# Patient Record
Sex: Female | Born: 1980 | Race: Black or African American | Hispanic: No | Marital: Single | Smoking: Current every day smoker
Health system: Southern US, Community
[De-identification: ages and names within clinical notes are randomized; demographics above are authoritative.]

## PROBLEM LIST (undated history)

## (undated) HISTORY — PX: OTHER SURGICAL HISTORY: SHX169

---

## 2009-01-12 ENCOUNTER — Emergency Department (HOSPITAL_BASED_OUTPATIENT_CLINIC_OR_DEPARTMENT_OTHER): Admission: EM | Admit: 2009-01-12 | Discharge: 2009-01-12 | Payer: Self-pay | Admitting: Emergency Medicine

## 2009-01-12 ENCOUNTER — Ambulatory Visit: Payer: Self-pay | Admitting: Diagnostic Radiology

## 2015-04-16 ENCOUNTER — Encounter (HOSPITAL_BASED_OUTPATIENT_CLINIC_OR_DEPARTMENT_OTHER): Payer: Self-pay | Admitting: Emergency Medicine

## 2015-04-16 ENCOUNTER — Emergency Department (HOSPITAL_BASED_OUTPATIENT_CLINIC_OR_DEPARTMENT_OTHER)
Admission: EM | Admit: 2015-04-16 | Discharge: 2015-04-16 | Disposition: A | Discharge status: Home / Self Care | Payer: Medicaid Other | Attending: Emergency Medicine | Admitting: Emergency Medicine

## 2015-04-16 DIAGNOSIS — M549 Dorsalgia, unspecified: Secondary | ICD-10-CM | POA: Diagnosis present

## 2015-04-16 DIAGNOSIS — Z3202 Encounter for pregnancy test, result negative: Secondary | ICD-10-CM | POA: Insufficient documentation

## 2015-04-16 DIAGNOSIS — M545 Low back pain, unspecified: Secondary | ICD-10-CM

## 2015-04-16 DIAGNOSIS — Z720 Tobacco use: Secondary | ICD-10-CM | POA: Diagnosis not present

## 2015-04-16 LAB — URINALYSIS, ROUTINE W REFLEX MICROSCOPIC
BILIRUBIN URINE: NEGATIVE
Glucose, UA: NEGATIVE mg/dL
Hgb urine dipstick: NEGATIVE
Ketones, ur: NEGATIVE mg/dL
Nitrite: NEGATIVE
PH: 7 (ref 5.0–8.0)
Protein, ur: NEGATIVE mg/dL
Specific Gravity, Urine: 1.02 (ref 1.005–1.030)
Urobilinogen, UA: 1 mg/dL (ref 0.0–1.0)

## 2015-04-16 LAB — URINE MICROSCOPIC-ADD ON

## 2015-04-16 LAB — PREGNANCY, URINE: PREG TEST UR: NEGATIVE

## 2015-04-16 MED ORDER — CYCLOBENZAPRINE HCL 10 MG PO TABS: 10.0000 mg | ORAL_TABLET | Freq: Two times a day (BID) | ORAL | Status: DC | PRN

## 2015-04-16 MED ORDER — KETOROLAC TROMETHAMINE 60 MG/2ML IM SOLN
60.0000 mg | Freq: Once | INTRAMUSCULAR | Status: AC
Start: 2015-04-16 — End: 2015-04-16
  Administered 2015-04-16: 60 mg via INTRAMUSCULAR
  Filled 2015-04-16: qty 2

## 2015-04-16 MED ORDER — NAPROXEN 500 MG PO TABS: 500.0000 mg | ORAL_TABLET | Freq: Two times a day (BID) | ORAL | Status: DC

## 2015-04-16 NOTE — ED Notes (Signed)
Lower back pain since last night. Denies injury.
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
## 2015-04-16 NOTE — ED Notes (Signed)
Denies numbness or tingling in lateral LW.  Grimacing and reports increased pain with straight leg exam.  Denies bowel or bladder incontinence. Denies recent injury.

## 2015-04-16 NOTE — Discharge Instructions (Signed)
Back Pain, Adult Low back pain is very common. About 1 in 5 people have back pain.The cause of low back pain is rarely dangerous. The pain often gets better over time.About half of people with a sudden onset of back pain feel better in just 2 weeks. About 8 in 10 people feel better by 6 weeks.  CAUSES Some common causes of back pain include:  Strain of the muscles or ligaments supporting the spine.  Wear and tear (degeneration) of the spinal discs.  Arthritis.  Direct injury to the back. DIAGNOSIS Most of the time, the direct cause of low back pain is not known.However, back pain can be treated effectively even when the exact cause of the pain is unknown.Answering your caregiver's questions about your overall health and symptoms is one of the most accurate ways to make sure the cause of your pain is not dangerous. If your caregiver needs more information, he or she may order lab work or imaging tests (X-rays or MRIs).However, even if imaging tests show changes in your back, this usually does not require surgery. HOME CARE INSTRUCTIONS For many people, back pain returns.Since low back pain is rarely dangerous, it is often a condition that people can learn to manageon their own.   Remain active. It is stressful on the back to sit or stand in one place. Do not sit, drive, or stand in one place for more than 30 minutes at a time. Take short walks on level surfaces as soon as pain allows.Try to increase the length of time you walk each day.  Do not stay in bed.Resting more than 1 or 2 days can delay your recovery.  Do not avoid exercise or work.Your body is made to move.It is not dangerous to be active, even though your back may hurt.Your back will likely heal faster if you return to being active before your pain is gone.  Pay attention to your body when you bend and lift. Many people have less discomfortwhen lifting if they bend their knees, keep the load close to their bodies,and  avoid twisting. Often, the most comfortable positions are those that put less stress on your recovering back.  Find a comfortable position to sleep. Use a firm mattress and lie on your side with your knees slightly bent. If you lie on your back, put a pillow under your knees.  Only take over-the-counter or prescription medicines as directed by your caregiver. Over-the-counter medicines to reduce pain and inflammation are often the most helpful.Your caregiver may prescribe muscle relaxant drugs.These medicines help dull your pain so you can more quickly return to your normal activities and healthy exercise.  Put ice on the injured area.  Put ice in a plastic bag.  Place a towel between your skin and the bag.  Leave the ice on for 15-20 minutes, 03-04 times a day for the first 2 to 3 days. After that, ice and heat may be alternated to reduce pain and spasms.  Ask your caregiver about trying back exercises and gentle massage. This may be of some benefit.  Avoid feeling anxious or stressed.Stress increases muscle tension and can worsen back pain.It is important to recognize when you are anxious or stressed and learn ways to manage it.Exercise is a great option. SEEK MEDICAL CARE IF:  You have pain that is not relieved with rest or medicine.  You have pain that does not improve in 1 week.  You have new symptoms.  You are generally not feeling well. SEEK   IMMEDIATE MEDICAL CARE IF:   You have pain that radiates from your back into your legs.  You develop new bowel or bladder control problems.  You have unusual weakness or numbness in your arms or legs.  You develop nausea or vomiting.  You develop abdominal pain.  You feel faint. Document Released: 10/02/2005 Document Revised: 04/02/2012 Document Reviewed: 02/03/2014 Exit[employer] Patient Information 2015 [employer]. This information is not intended to replace advice given to you by your health care provider. Make sure you  discuss any questions you have with your health care provider.  

## 2015-04-18 LAB — URINE CULTURE: Special Requests: NORMAL

## 2018-08-07 ENCOUNTER — Emergency Department (HOSPITAL_BASED_OUTPATIENT_CLINIC_OR_DEPARTMENT_OTHER): Payer: Medicaid Other

## 2018-08-07 ENCOUNTER — Other Ambulatory Visit: Payer: Self-pay

## 2018-08-07 ENCOUNTER — Encounter (HOSPITAL_BASED_OUTPATIENT_CLINIC_OR_DEPARTMENT_OTHER): Payer: Self-pay | Admitting: Emergency Medicine

## 2018-08-07 ENCOUNTER — Emergency Department (HOSPITAL_BASED_OUTPATIENT_CLINIC_OR_DEPARTMENT_OTHER)
Admission: EM | Admit: 2018-08-07 | Discharge: 2018-08-07 | Disposition: A | Discharge status: Home / Self Care | Payer: Medicaid Other | Attending: Emergency Medicine | Admitting: Emergency Medicine

## 2018-08-07 DIAGNOSIS — N83291 Other ovarian cyst, right side: Secondary | ICD-10-CM | POA: Insufficient documentation

## 2018-08-07 DIAGNOSIS — F17210 Nicotine dependence, cigarettes, uncomplicated: Secondary | ICD-10-CM | POA: Insufficient documentation

## 2018-08-07 DIAGNOSIS — Z79899 Other long term (current) drug therapy: Secondary | ICD-10-CM | POA: Insufficient documentation

## 2018-08-07 DIAGNOSIS — N83209 Unspecified ovarian cyst, unspecified side: Secondary | ICD-10-CM

## 2018-08-07 DIAGNOSIS — R109 Unspecified abdominal pain: Secondary | ICD-10-CM | POA: Diagnosis present

## 2018-08-07 LAB — LIPASE, BLOOD: Lipase: 29 U/L (ref 11–51)

## 2018-08-07 LAB — COMPREHENSIVE METABOLIC PANEL
ALBUMIN: 3.7 g/dL (ref 3.5–5.0)
ALK PHOS: 50 U/L (ref 38–126)
ALT: 13 U/L (ref 0–44)
ANION GAP: 6 (ref 5–15)
AST: 14 U/L — ABNORMAL LOW (ref 15–41)
BUN: 12 mg/dL (ref 6–20)
CALCIUM: 8.4 mg/dL — AB (ref 8.9–10.3)
CO2: 26 mmol/L (ref 22–32)
CREATININE: 0.67 mg/dL (ref 0.44–1.00)
Chloride: 107 mmol/L (ref 98–111)
GFR calc Af Amer: >60 mL/min (ref >60)
GFR calc non Af Amer: >60 mL/min (ref >60)
Glucose, Bld: 91 mg/dL (ref 70–99)
Potassium: 3.6 mmol/L (ref 3.5–5.1)
SODIUM: 139 mmol/L (ref 135–145)
Total Bilirubin: 0.7 mg/dL (ref 0.3–1.2)
Total Protein: 6.9 g/dL (ref 6.5–8.1)

## 2018-08-07 LAB — CBC WITH DIFFERENTIAL/PLATELET
ABS IMMATURE GRANULOCYTES: 0.05 10*3/uL (ref 0.00–0.07)
BASOS ABS: 0.1 10*3/uL (ref 0.0–0.1)
Basophils Relative: 0 %
Eosinophils Absolute: 0.6 10*3/uL — ABNORMAL HIGH (ref 0.0–0.5)
Eosinophils Relative: 4 %
HCT: 38.7 % (ref 36.0–46.0)
HEMOGLOBIN: 12.2 g/dL (ref 12.0–15.0)
IMMATURE GRANULOCYTES: 0 %
LYMPHS PCT: 7 %
Lymphs Abs: 1 10*3/uL (ref 0.7–4.0)
MCH: 29.5 pg (ref 26.0–34.0)
MCHC: 31.5 g/dL (ref 30.0–36.0)
MCV: 93.5 fL (ref 80.0–100.0)
Monocytes Absolute: 0.6 10*3/uL (ref 0.1–1.0)
Monocytes Relative: 5 %
NEUTROS ABS: 11.3 10*3/uL — AB (ref 1.7–7.7)
NRBC: 0 % (ref 0.0–0.2)
Neutrophils Relative %: 84 %
Platelets: 274 10*3/uL (ref 150–400)
RBC: 4.14 MIL/uL (ref 3.87–5.11)
RDW: 14.3 % (ref 11.5–15.5)
WBC: 13.5 10*3/uL — AB (ref 4.0–10.5)

## 2018-08-07 LAB — URINALYSIS, MICROSCOPIC (REFLEX)

## 2018-08-07 LAB — URINALYSIS, ROUTINE W REFLEX MICROSCOPIC
BILIRUBIN URINE: NEGATIVE
GLUCOSE, UA: NEGATIVE mg/dL
Hgb urine dipstick: NEGATIVE
Ketones, ur: NEGATIVE mg/dL
Nitrite: NEGATIVE
PH: 6 (ref 5.0–8.0)
Protein, ur: NEGATIVE mg/dL
SPECIFIC GRAVITY, URINE: 1.02 (ref 1.005–1.030)

## 2018-08-07 LAB — PREGNANCY, URINE: Preg Test, Ur: NEGATIVE

## 2018-08-07 MED ORDER — LIDOCAINE-EPINEPHRINE 2 %-1:100000 IJ SOLN
20.0000 mL | Freq: Once | INTRAMUSCULAR | Status: DC
  Filled 2018-08-07: qty 20

## 2018-08-07 MED ORDER — MORPHINE SULFATE (PF) 4 MG/ML IV SOLN
4.0000 mg | Freq: Once | INTRAVENOUS | Status: AC
  Administered 2018-08-07: 4 mg via INTRAVENOUS
  Filled 2018-08-07: qty 1

## 2018-08-07 MED ORDER — IOPAMIDOL (ISOVUE-300) INJECTION 61%
100.0000 mL | Freq: Once | INTRAVENOUS | Status: AC | PRN
  Administered 2018-08-07: 100 mL via INTRAVENOUS

## 2018-08-07 MED ORDER — SODIUM CHLORIDE 0.9 % IV BOLUS
1000.0000 mL | Freq: Once | INTRAVENOUS | Status: AC
  Administered 2018-08-07: 1000 mL via INTRAVENOUS

## 2018-08-07 NOTE — ED Triage Notes (Signed)
Abd pain x 4 days, dysuria. Last BM this am, per norm. Vomited x 1 earlier

## 2018-08-07 NOTE — ED Notes (Signed)
Patient transported to Ultrasound 

## 2018-08-07 NOTE — ED Notes (Signed)
ED Provider at bedside. 

## 2018-08-07 NOTE — ED Notes (Signed)
Patient transported to CT 

## 2018-08-07 NOTE — ED Notes (Signed)
Patient is resting comfortably. Family at bedside.  

## 2018-08-07 NOTE — Discharge Instructions (Signed)
Take Tylenol and ibuprofen as needed for pain.  Follow-up with OB/GYN as directed.

## 2018-08-07 NOTE — ED Provider Notes (Signed)
[facility] EMERGENCY DEPARTMENT Provider Note   CSN: [id number] Arrival date & time: 08/07/18  4098     History   Chief Complaint Chief Complaint  Patient presents with  . Abdominal Pain    HPI Heather Maddox is a 37 y.o. female.  Patient with no significant medical or surgical history, currently not pregnant presents with worsening generalized abdominal pain without radiation for the past 4 days.  Vomited once, mild nausea.  No vaginal symptoms.  Mild worsening with urination.  No history of similar     History reviewed. No pertinent past medical history.  There are no active problems to display for this patient.   Past Surgical History:  Procedure Laterality Date  . OTHER SURGICAL HISTORY      birthmark removed from forehead age 7     OB History    Gravida  3   Para  2   Term  2   Preterm  0   AB  1   Living  2     SAB  1   TAB  0   Ectopic  0   Multiple  0   Live Births  2            Home Medications    Prior to Admission medications   Medication Sig Start Date End Date Taking? Authorizing Provider  cyclobenzaprine (FLEXERIL) 10 MG tablet Take 1 tablet (10 mg total) by mouth 2 (two) times daily as needed for muscle spasms. 04/16/15   Rolan Bucco, MD  naproxen (NAPROSYN) 500 MG tablet Take 1 tablet (500 mg total) by mouth 2 (two) times daily. 04/16/15   Rolan Bucco, MD    Family History No family history on file.  Social History Social History   Tobacco Use  . Smoking status: Current Every Day Smoker    Packs/day: 0.20    Years: 21.00    Pack years: 4.20    Types: Cigarettes  . Smokeless tobacco: Never Used  Substance Use Topics  . Alcohol use: Yes    Comment: socially  . Drug use: Yes    Types: Marijuana    Comment: daily     Allergies   Patient has no known allergies.   Review of Systems Review of Systems   Physical Exam Updated Vital Signs BP 124/87 (BP Location: Left Arm)   Pulse 62   Temp  97.8 F (36.6 C) (Oral)   Resp 16   Ht 5\' 3"  (1.6 m)   Wt 50.1 kg   LMP 06/30/2018   SpO2 100%   BMI 19.57 kg/m   Physical Exam  Constitutional: She is oriented to person, place, and time. She appears well-developed and well-nourished.  HENT:  Head: Normocephalic and atraumatic.  Eyes: Conjunctivae are normal. Right eye exhibits no discharge. Left eye exhibits no discharge.  Neck: Normal range of motion. Neck supple. No tracheal deviation present.  Cardiovascular: Normal rate and regular rhythm.  Pulmonary/Chest: Effort normal and breath sounds normal.  Abdominal: Soft. She exhibits no distension. There is tenderness. There is guarding (periumbilical).  Musculoskeletal: She exhibits no edema.  Neurological: She is alert and oriented to person, place, and time.  Skin: Skin is warm. No rash noted.  Psychiatric: She has a normal mood and affect.  Nursing note and vitals reviewed.    ED Treatments / Results  Labs (all labs ordered are listed, but only abnormal results are displayed) Labs Reviewed  URINALYSIS, ROUTINE W REFLEX MICROSCOPIC - Abnormal; Notable  for the following components:      Result Value   Leukocytes, UA MODERATE (*)    All other components within normal limits  CBC WITH DIFFERENTIAL/PLATELET - Abnormal; Notable for the following components:   WBC 13.5 (*)    Neutro Abs 11.3 (*)    Eosinophils Absolute 0.6 (*)    All other components within normal limits  COMPREHENSIVE METABOLIC PANEL - Abnormal; Notable for the following components:   Calcium 8.4 (*)    AST 14 (*)    All other components within normal limits  URINALYSIS, MICROSCOPIC (REFLEX) - Abnormal; Notable for the following components:   Bacteria, UA RARE (*)    All other components within normal limits  PREGNANCY, URINE  LIPASE, BLOOD    EKG None  Radiology US Transvaginal Non-ob  Result Date: 08/07/2018 CLINICAL DATA:  Abdominal and pelvic pain. Cystic adnexal lesion seen on recent CT.  LMP 06/30/2018. EXAM: TRANSABDOMINAL AND TRANSVAGINAL ULTRASOUND OF PELVIS DOPPLER ULTRASOUND OF OVARIES TECHNIQUE: Both transabdominal and transvaginal ultrasound examinations of the pelvis were performed. Transabdominal technique was performed for global imaging of the pelvis including uterus, ovaries, adnexal regions, and pelvic cul-de-sac. It was necessary to proceed with endovaginal exam following the transabdominal exam to visualize the endometrium and ovaries. Color and duplex Doppler ultrasound was utilized to evaluate blood flow to the ovaries. COMPARISON:  CT on 08/07/2018 FINDINGS: Uterus Measurements: 10.8 x 4.9 x 5.8 cm. Retroverted. No fibroids or other mass visualized. Endometrium Thickness: 13 mm.  No focal abnormality visualized. Right ovary Measurements: 5.5 x 3.4 x 4.9 cm. A hemorrhagic cyst with reticular pattern of internal echos and lack of internal blood flow on color Doppler is seen which measures 4.3 cm. Left ovary Measurements: 3.9 x 2.0 x 3.1 cm. Normal appearance/no adnexal mass. Pulsed Doppler evaluation of both ovaries demonstrates normal low-resistance arterial and venous waveforms. Other findings No abnormal free fluid. IMPRESSION: 4.3 cm right ovarian hemorrhagic cyst. Recommend continued follow-up by ultrasound in 6-12 weeks to confirm resolution. This recommendation follows the consensus statement: Management of Asymptomatic Ovarian and Other Adnexal Cysts Imaged at [place]: Society of Radiologists in Ultrasound Consensus Conference Statement. Radiology 2010; [phone]. No sonographic evidence for ovarian torsion. Normal appearance of uterus and left ovary. Electronically Signed   By: Myles Rosenthal M.D.   On: 08/07/2018 15:33   US Pelvis Complete  Result Date: 08/07/2018 CLINICAL DATA:  Abdominal and pelvic pain. Cystic adnexal lesion seen on recent CT. LMP 06/30/2018. EXAM: TRANSABDOMINAL AND TRANSVAGINAL ULTRASOUND OF PELVIS DOPPLER ULTRASOUND OF OVARIES TECHNIQUE: Both  transabdominal and transvaginal ultrasound examinations of the pelvis were performed. Transabdominal technique was performed for global imaging of the pelvis including uterus, ovaries, adnexal regions, and pelvic cul-de-sac. It was necessary to proceed with endovaginal exam following the transabdominal exam to visualize the endometrium and ovaries. Color and duplex Doppler ultrasound was utilized to evaluate blood flow to the ovaries. COMPARISON:  CT on 08/07/2018 FINDINGS: Uterus Measurements: 10.8 x 4.9 x 5.8 cm. Retroverted. No fibroids or other mass visualized. Endometrium Thickness: 13 mm.  No focal abnormality visualized. Right ovary Measurements: 5.5 x 3.4 x 4.9 cm. A hemorrhagic cyst with reticular pattern of internal echos and lack of internal blood flow on color Doppler is seen which measures 4.3 cm. Left ovary Measurements: 3.9 x 2.0 x 3.1 cm. Normal appearance/no adnexal mass. Pulsed Doppler evaluation of both ovaries demonstrates normal low-resistance arterial and venous waveforms. Other findings No abnormal free fluid. IMPRESSION: 4.3 cm right ovarian hemorrhagic  cyst. Recommend continued follow-up by ultrasound in 6-12 weeks to confirm resolution. This recommendation follows the consensus statement: Management of Asymptomatic Ovarian and Other Adnexal Cysts Imaged at [place]: Society of Radiologists in Ultrasound Consensus Conference Statement. Radiology 2010; [phone]. No sonographic evidence for ovarian torsion. Normal appearance of uterus and left ovary. Electronically Signed   By: Myles Rosenthal M.D.   On: 08/07/2018 15:33   Ct Abdomen Pelvis W Contrast  Result Date: 08/07/2018 CLINICAL DATA:  Abdominal pain.  Vomiting.  Dysuria. EXAM: CT ABDOMEN AND PELVIS WITH CONTRAST TECHNIQUE: Multidetector CT imaging of the abdomen and pelvis was performed using the standard protocol following bolus administration of intravenous contrast. CONTRAST:  ISOVUE-300 IOPAMIDOL (ISOVUE-300) INJECTION 61%  COMPARISON:  None. FINDINGS: Lower chest: No acute abnormality. Hepatobiliary: There are several low-attenuation foci within the liver which measure less than 1 cm and are too small to reliably characterize. The gallbladder appears normal. No biliary dilatation. Pancreas: Unremarkable. No pancreatic ductal dilatation or surrounding inflammatory changes. Spleen: Normal in size without focal abnormality. Adrenals/Urinary Tract: Normal appearance of the adrenal glands. Kidneys are normal in appearance. Urinary bladder appears normal. Stomach/Bowel: Stomach negative. Small bowel loops are unremarkable. The appendix is not confidently identified separate from the right lower quadrant bowel loops. No secondary signs of acute appendicitis however. No pathologic dilatation of the colon. Vascular/Lymphatic: Normal appearance of the abdominal aorta. The portal vein and hepatic veins appear patent. No abdominal adenopathy. No pelvic or inguinal adenopathy. Reproductive: The uterus appears retroflexed. Small amount of fluid noted within the endometrial canal. The nabothian cysts identified within the cervix. There is increased vascularity and multiple large bilateral parametrial veins identified which may be seen with pelvic venous congestion. In the right adnexa there is a cyst measuring 3.2 cm, image 57/2. Other: No abdominal wall hernia or abnormality. No abdominopelvic ascites. Musculoskeletal: No acute or significant osseous findings. IMPRESSION: 1. There is increased vascularity and prominent bilateral parametrial veins within the pelvis which may be seen with pelvic venous congestion. 2. 3 cm right ovary cyst. 3. Nonvisualization of the appendix. No secondary signs of acute appendicitis Electronically Signed   By: Signa Kell M.D.   On: 08/07/2018 11:27   [place] Art/ven Flow Abd Pelv Doppler  Result Date: 08/07/2018 CLINICAL DATA:  Abdominal and pelvic pain. Cystic adnexal lesion seen on recent CT. LMP 06/30/2018.  EXAM: TRANSABDOMINAL AND TRANSVAGINAL ULTRASOUND OF PELVIS DOPPLER ULTRASOUND OF OVARIES TECHNIQUE: Both transabdominal and transvaginal ultrasound examinations of the pelvis were performed. Transabdominal technique was performed for global imaging of the pelvis including uterus, ovaries, adnexal regions, and pelvic cul-de-sac. It was necessary to proceed with endovaginal exam following the transabdominal exam to visualize the endometrium and ovaries. Color and duplex Doppler ultrasound was utilized to evaluate blood flow to the ovaries. COMPARISON:  CT on 08/07/2018 FINDINGS: Uterus Measurements: 10.8 x 4.9 x 5.8 cm. Retroverted. No fibroids or other mass visualized. Endometrium Thickness: 13 mm.  No focal abnormality visualized. Right ovary Measurements: 5.5 x 3.4 x 4.9 cm. A hemorrhagic cyst with reticular pattern of internal echos and lack of internal blood flow on color Doppler is seen which measures 4.3 cm. Left ovary Measurements: 3.9 x 2.0 x 3.1 cm. Normal appearance/no adnexal mass. Pulsed Doppler evaluation of both ovaries demonstrates normal low-resistance arterial and venous waveforms. Other findings No abnormal free fluid. IMPRESSION: 4.3 cm right ovarian hemorrhagic cyst. Recommend continued follow-up by ultrasound in 6-12 weeks to confirm resolution. This recommendation follows the consensus statement: Management of  Asymptomatic Ovarian and Other Adnexal Cysts Imaged at [place]: Society of Radiologists in Ultrasound Consensus Conference Statement. Radiology 2010; [phone]. No sonographic evidence for ovarian torsion. Normal appearance of uterus and left ovary. Electronically Signed   By: Myles Rosenthal M.D.   On: 08/07/2018 15:33    Procedures Procedures (including critical care time)  Medications Ordered in ED Medications  morphine 4 MG/ML injection 4 mg (4 mg Intravenous Given 08/07/18 1052)  sodium chloride 0.9 % bolus 1,000 mL (0 mLs Intravenous Stopped 08/07/18 1243)  iopamidol (ISOVUE-300)  61 % injection 100 mL (100 mLs Intravenous Contrast Given 08/07/18 1041)  morphine 4 MG/ML injection 4 mg (4 mg Intravenous Given 08/07/18 1423)     Initial Impression / Assessment and Plan / ED Course  I have reviewed the triage vital signs and the nursing notes.  Pertinent labs & imaging results that were available during my care of the patient were reviewed by me and considered in my medical decision making (see chart for details).    Patient presents with diffuse abdominal pain worsening for 4 days.  No focality to pain however with guarding and worsening symptoms plan for CT scan for further delineation.  Blood work reviewed mild leukocytosis. Reviewed CT scan with patient no acute findings, dilation of pelvic veins and 3 cm cyst which may be contributing to pain.  Ultrasound ordered to check blood flow and any other cause of patient's persistent pain.  Patient has mild leukocytosis.  UA unremarkable.,  Ultrasound showed hemorrhagic cyst.  Patient will follow-up with OB/GYN.  Pain improved in the ER.  Final Clinical Impressions(s) / ED Diagnoses   Final diagnoses:  Acute abdominal pain  Cyst of ovary, unspecified laterality    ED Discharge Orders    None       Blane Ohara, MD 08/07/18 1541

## 2018-10-05 ENCOUNTER — Emergency Department (HOSPITAL_BASED_OUTPATIENT_CLINIC_OR_DEPARTMENT_OTHER)
Admission: EM | Admit: 2018-10-05 | Discharge: 2018-10-05 | Disposition: A | Discharge status: Home / Self Care | Payer: Medicaid Other | Attending: Emergency Medicine | Admitting: Emergency Medicine

## 2018-10-05 ENCOUNTER — Other Ambulatory Visit: Payer: Self-pay

## 2018-10-05 ENCOUNTER — Encounter (HOSPITAL_BASED_OUTPATIENT_CLINIC_OR_DEPARTMENT_OTHER): Payer: Self-pay | Admitting: Emergency Medicine

## 2018-10-05 ENCOUNTER — Emergency Department (HOSPITAL_BASED_OUTPATIENT_CLINIC_OR_DEPARTMENT_OTHER): Payer: Medicaid Other

## 2018-10-05 DIAGNOSIS — Z79899 Other long term (current) drug therapy: Secondary | ICD-10-CM | POA: Diagnosis not present

## 2018-10-05 DIAGNOSIS — B9789 Other viral agents as the cause of diseases classified elsewhere: Secondary | ICD-10-CM | POA: Diagnosis not present

## 2018-10-05 DIAGNOSIS — R05 Cough: Secondary | ICD-10-CM | POA: Diagnosis not present

## 2018-10-05 DIAGNOSIS — J069 Acute upper respiratory infection, unspecified: Secondary | ICD-10-CM

## 2018-10-05 DIAGNOSIS — F17210 Nicotine dependence, cigarettes, uncomplicated: Secondary | ICD-10-CM | POA: Diagnosis not present

## 2018-10-05 MED ORDER — ALBUTEROL SULFATE HFA 108 (90 BASE) MCG/ACT IN AERS
INHALATION_SPRAY | RESPIRATORY_TRACT | Status: AC
  Filled 2018-10-05: qty 6.7

## 2018-10-05 MED ORDER — ALBUTEROL SULFATE (2.5 MG/3ML) 0.083% IN NEBU
5.0000 mg | INHALATION_SOLUTION | Freq: Once | RESPIRATORY_TRACT | Status: AC
  Administered 2018-10-05: 5 mg via RESPIRATORY_TRACT
  Filled 2018-10-05: qty 6

## 2018-10-05 MED ORDER — ALBUTEROL SULFATE HFA 108 (90 BASE) MCG/ACT IN AERS
2.0000 | INHALATION_SPRAY | RESPIRATORY_TRACT | Status: DC | PRN
  Administered 2018-10-05: 2 via RESPIRATORY_TRACT

## 2018-10-05 MED ORDER — PREDNISONE 50 MG PO TABS
50.0000 mg | ORAL_TABLET | Freq: Once | ORAL | Status: AC
  Administered 2018-10-05: 50 mg via ORAL
  Filled 2018-10-05: qty 1

## 2018-10-05 MED ORDER — PREDNISONE 20 MG PO TABS: 40.0000 mg | ORAL_TABLET | Freq: Every day | ORAL | 0 refills | Status: AC

## 2018-10-05 MED ORDER — IPRATROPIUM-ALBUTEROL 0.5-2.5 (3) MG/3ML IN SOLN: 3.0000 mL | Freq: Four times a day (QID) | RESPIRATORY_TRACT | Status: DC

## 2018-10-05 MED ORDER — IPRATROPIUM-ALBUTEROL 0.5-2.5 (3) MG/3ML IN SOLN
3.0000 mL | Freq: Four times a day (QID) | RESPIRATORY_TRACT | Status: DC
  Administered 2018-10-05: 3 mL via RESPIRATORY_TRACT
  Filled 2018-10-05: qty 3

## 2018-10-05 NOTE — Discharge Instructions (Addendum)
I'd recommend using the following:  - Use your INHALER every 4 hours while awake for the next day, then every 4-6 hours as needed for wheezing or shortness of breath; use 2 puffs  - Take the steroid as prescribed  - A humidifier may help; if you don't have one, try a warm shower with the door closed

## 2018-10-05 NOTE — ED Triage Notes (Signed)
Pt c/o congested, non-productive cough x 4 days with no fever. Body aches present and insomnia.

## 2018-10-05 NOTE — ED Provider Notes (Signed)
[facility] EMERGENCY DEPARTMENT Provider Note   CSN: [id number] Arrival date & time: 10/05/18  [id number]     History   Chief Complaint Chief Complaint  Patient presents with  . Cough    HPI Heather Maddox is a 37 y.o. female.  HPI   37 yo F with PMHx tobacco abuse here with cough.  The patient states that over the last 4 days, she has had progressively worsening cough, shortness of breath, and general fatigue.  She has several grandchildren with RSV, who are hospitalized.  She has been around them daily.  She reports her symptoms started with moderate nasal congestion and sore throat followed by coughing.  She has had a wheezy type cough since 2 days ago, that has persistently worsened.  She is been wheezing.  She is had some associated occasional shortness of breath.  Does not recall any specific fevers, but has not been measuring.  Denies history of COPD or asthma.  No other complaints.  History reviewed. No pertinent past medical history.  There are no active problems to display for this patient.   Past Surgical History:  Procedure Laterality Date  . OTHER SURGICAL HISTORY      birthmark removed from forehead age [age]     OB History    Gravida  3   Para  2   Term  2   Preterm  0   AB  1   Living  2     SAB  1   TAB  0   Ectopic  0   Multiple  0   Live Births  2            Home Medications    Prior to Admission medications   Medication Sig Start Date End Date Taking? Authorizing Provider  cyclobenzaprine (FLEXERIL) 10 MG tablet Take 1 tablet (10 mg total) by mouth 2 (two) times daily as needed for muscle spasms. 04/16/15   Rolan BuccoBelfi, Melanie, MD  naproxen (NAPROSYN) 500 MG tablet Take 1 tablet (500 mg total) by mouth 2 (two) times daily. 04/16/15   Rolan BuccoBelfi, Melanie, MD  predniSONE (DELTASONE) 20 MG tablet Take 2 tablets (40 mg total) by mouth daily for 5 days. 10/05/18 10/10/18  Shaune PollackIsaacs, Cayde Held, MD    Family History History reviewed. No pertinent  family history.  Social History Social History   Tobacco Use  . Smoking status: Current Every Day Smoker    Packs/day: 0.20    Years: 21.00    Pack years: 4.20    Types: Cigarettes  . Smokeless tobacco: Never Used  Substance Use Topics  . Alcohol use: Yes    Comment: socially  . Drug use: Yes    Types: Marijuana    Comment: daily     Allergies   Patient has no known allergies.   Review of Systems Review of Systems  Constitutional: Positive for fatigue. Negative for chills and fever.  HENT: Positive for congestion and sore throat. Negative for rhinorrhea.   Eyes: Negative for visual disturbance.  Respiratory: Positive for cough, shortness of breath and wheezing.   Cardiovascular: Negative for chest pain and leg swelling.  Gastrointestinal: Negative for abdominal pain, diarrhea, nausea and vomiting.  Genitourinary: Negative for dysuria and flank pain.  Musculoskeletal: Negative for neck pain and neck stiffness.  Skin: Negative for rash and wound.  Allergic/Immunologic: Negative for immunocompromised state.  Neurological: Negative for syncope, weakness and headaches.  All other systems reviewed and are negative.    Physical  Exam Updated Vital Signs BP 119/76 (BP Location: Right Arm)   Pulse 80   Temp 98.3 F (36.8 C) (Oral)   Resp 18   Ht 5\' 4"  (1.626 m)   SpO2 100%   BMI 18.97 kg/m   Physical Exam Vitals signs and nursing note reviewed.  Constitutional:      General: She is not in acute distress.    Appearance: She is well-developed.  HENT:     Head: Normocephalic and atraumatic.     Comments: Moderate posterior pharyngeal erythema w/o tonsillar swelling or exudates Eyes:     Conjunctiva/sclera: Conjunctivae normal.  Neck:     Musculoskeletal: Neck supple.  Cardiovascular:     Rate and Rhythm: Normal rate and regular rhythm.     Heart sounds: Normal heart sounds. No murmur. No friction rub.  Pulmonary:     Effort: Pulmonary effort is normal.  Tachypnea present. No respiratory distress.     Breath sounds: Decreased air movement present. Examination of the right-upper field reveals wheezing. Examination of the left-upper field reveals wheezing. Examination of the right-middle field reveals wheezing. Examination of the left-middle field reveals wheezing. Examination of the right-lower field reveals wheezing. Examination of the left-lower field reveals wheezing. Wheezing present. No rales.  Abdominal:     General: There is no distension.     Palpations: Abdomen is soft.     Tenderness: There is no abdominal tenderness.  Skin:    General: Skin is warm.     Capillary Refill: Capillary refill takes less than 2 seconds.  Neurological:     Mental Status: She is alert and oriented to person, place, and time.     Motor: No abnormal muscle tone.      ED Treatments / Results  Labs (all labs ordered are listed, but only abnormal results are displayed) Labs Reviewed - No data to display  EKG None  Radiology Dg Chest 2 View  Result Date: 10/05/2018 CLINICAL DATA:  Nonproductive cough for 4 days. EXAM: CHEST - 2 VIEW COMPARISON:  None. FINDINGS: The heart size and mediastinal contours are within normal limits. Both lungs are clear. The visualized skeletal structures are unremarkable. IMPRESSION: Negative.  No active cardiopulmonary disease. Electronically Signed   By: Myles Rosenthal M.D.   On: 10/05/2018 09:44    Procedures Procedures (including critical care time)  Medications Ordered in ED Medications  albuterol (PROVENTIL HFA;VENTOLIN HFA) 108 (90 Base) MCG/ACT inhaler 2 puff (2 puffs Inhalation Given 10/05/18 1050)  albuterol (PROVENTIL) (2.5 MG/3ML) 0.083% nebulizer solution 5 mg (5 mg Nebulization Given 10/05/18 0934)  predniSONE (DELTASONE) tablet 50 mg (50 mg Oral Given 10/05/18 1008)     Initial Impression / Assessment and Plan / ED Course  I have reviewed the triage vital signs and the nursing notes.  Pertinent labs &  imaging results that were available during my care of the patient were reviewed by me and considered in my medical decision making (see chart for details).     37 yo F here w/ wheezing, cough, in setting of known RSV exposure. Suspect RSV/URI with component of underlying RAD/COPD given smoking history. Pt initially very tight with diffuse wheezes, given steroids/breathing tx. She has had excellent relief w/ treatments and is now breathing comfortably, sats>100, and normal WOB. CXR w/o PNA. Will tx with steroids, nebs. No signs of DVT/PE clinically. No chest pain.  Final Clinical Impressions(s) / ED Diagnoses   Final diagnoses:  Viral URI with cough    ED Discharge  Orders         Ordered    predniSONE (DELTASONE) 20 MG tablet  Daily     10/05/18 1106           Shaune PollackIsaacs, Arihaan Bellucci, MD 10/05/18 1108

## 2018-10-25 DIAGNOSIS — H538 Other visual disturbances: Secondary | ICD-10-CM | POA: Diagnosis not present

## 2018-10-25 DIAGNOSIS — H524 Presbyopia: Secondary | ICD-10-CM | POA: Diagnosis not present

## 2018-11-01 DIAGNOSIS — H5213 Myopia, bilateral: Secondary | ICD-10-CM | POA: Diagnosis not present

## 2018-12-13 DIAGNOSIS — H52223 Regular astigmatism, bilateral: Secondary | ICD-10-CM | POA: Diagnosis not present

## 2018-12-13 DIAGNOSIS — H5213 Myopia, bilateral: Secondary | ICD-10-CM | POA: Diagnosis not present

## 2019-06-18 ENCOUNTER — Other Ambulatory Visit: Payer: Self-pay

## 2019-06-18 ENCOUNTER — Emergency Department (HOSPITAL_BASED_OUTPATIENT_CLINIC_OR_DEPARTMENT_OTHER)
Admission: EM | Admit: 2019-06-18 | Discharge: 2019-06-18 | Disposition: A | Discharge status: Home / Self Care | Payer: Medicaid Other | Attending: Emergency Medicine | Admitting: Emergency Medicine

## 2019-06-18 ENCOUNTER — Encounter (HOSPITAL_BASED_OUTPATIENT_CLINIC_OR_DEPARTMENT_OTHER): Payer: Self-pay

## 2019-06-18 DIAGNOSIS — R21 Rash and other nonspecific skin eruption: Secondary | ICD-10-CM | POA: Diagnosis not present

## 2019-06-18 DIAGNOSIS — F17210 Nicotine dependence, cigarettes, uncomplicated: Secondary | ICD-10-CM | POA: Insufficient documentation

## 2019-06-18 MED ORDER — PERMETHRIN 5 % EX CREA: TOPICAL_CREAM | CUTANEOUS | 0 refills | Status: DC

## 2019-06-18 MED ORDER — HYDROCORTISONE 2.5 % EX LOTN: TOPICAL_LOTION | Freq: Two times a day (BID) | CUTANEOUS | 0 refills | Status: DC

## 2019-06-18 NOTE — Discharge Instructions (Addendum)
Use permethrin as prescribed.  Avoid putting on your head.  Apply hydrocortisone cream twice daily to the affected area.  You can take Benadryl as prescribed at the counter, as needed for itching.  Please return the emergency department if you develop any new or worsening symptoms.

## 2019-06-18 NOTE — ED Triage Notes (Signed)
C/o rash to posterior upper left back area x 2 days-NAD-steady gait

## 2019-06-18 NOTE — ED Provider Notes (Signed)
[place] [facility] EMERGENCY DEPARTMENT Provider Note   CSN: [id number] Arrival date & time: 06/18/19  1610     History   Chief Complaint Chief Complaint  Patient presents with  . Rash    HPI Heather Maddox is a 38 y.o. female who presents with a 2-day history of rash to her left upper back.  She reports it started after sitting in a cloth chair at someone's house.  It is itchy and burning.  She has been putting Neosporin on it.  She denies any other symptoms.  No known sick contacts.  It is worse at night.     HPI  History reviewed. No pertinent past medical history.  There are no active problems to display for this patient.   Past Surgical History:  Procedure Laterality Date  . OTHER SURGICAL HISTORY      birthmark removed from forehead age 45     OB History    Gravida  3   Para  2   Term  2   Preterm  0   AB  1   Living  2     SAB  1   TAB  0   Ectopic  0   Multiple  0   Live Births  2            Home Medications    Prior to Admission medications   Medication Sig Start Date End Date Taking? Authorizing Provider  cyclobenzaprine (FLEXERIL) 10 MG tablet Take 1 tablet (10 mg total) by mouth 2 (two) times daily as needed for muscle spasms. 04/16/15   Malvin Johns, MD  hydrocortisone 2.5 % lotion Apply topically 2 (two) times daily. 06/18/19   Bubber Rothert, Bea Graff, PA-C  naproxen (NAPROSYN) 500 MG tablet Take 1 tablet (500 mg total) by mouth 2 (two) times daily. 04/16/15   Malvin Johns, MD  permethrin (ELIMITE) 5 % cream Apply to entire body and wash off after 8 to 14 hours.  Repeat in 1 week. 06/18/19   Frederica Kuster, PA-C    Family History No family history on file.  Social History Social History   Tobacco Use  . Smoking status: Current Every Day Smoker    Packs/day: 0.20    Years: 21.00    Pack years: 4.20    Types: Cigarettes  . Smokeless tobacco: Never Used  Substance Use Topics  . Alcohol use: Yes    Comment: occ  . Drug use: Yes     Types: Marijuana     Allergies   Patient has no known allergies.   Review of Systems Review of Systems  Constitutional: Negative for fever.  Skin: Positive for rash.     Physical Exam Updated Vital Signs BP 117/76 (BP Location: Right Arm)   Pulse 67   Temp 99.3 F (37.4 C) (Oral)   Resp 16   Ht 5\' 5"  (1.651 m)   Wt 52.6 kg   LMP  (LMP Unknown)   SpO2 100%   BMI 19.30 kg/m   Physical Exam Vitals signs and nursing note reviewed.  Constitutional:      General: She is not in acute distress.    Appearance: She is well-developed. She is not diaphoretic.  HENT:     Head: Normocephalic and atraumatic.     Mouth/Throat:     Pharynx: No oropharyngeal exudate.  Eyes:     General: No scleral icterus.       Right eye: No discharge.  Left eye: No discharge.     Conjunctiva/sclera: Conjunctivae normal.     Pupils: Pupils are equal, round, and reactive to light.  Neck:     Musculoskeletal: Normal range of motion and neck supple.     Thyroid: No thyromegaly.  Cardiovascular:     Rate and Rhythm: Normal rate and regular rhythm.     Heart sounds: Normal heart sounds. No murmur. No friction rub. No gallop.   Pulmonary:     Effort: Pulmonary effort is normal. No respiratory distress.     Breath sounds: Normal breath sounds. No stridor. No wheezing or rales.  Abdominal:     General: Bowel sounds are normal. There is no distension.     Palpations: Abdomen is soft.     Tenderness: There is no abdominal tenderness. There is no guarding or rebound.  Lymphadenopathy:     Cervical: No cervical adenopathy.  Skin:    General: Skin is warm and dry.     Coloration: Skin is not pale.     Findings: No rash.     Comments: Cluster of papules to the left upper shoulder, see photo, nontender  Neurological:     Mental Status: She is alert.     Coordination: Coordination normal.        ED Treatments / Results  Labs (all labs ordered are listed, but only abnormal results  are displayed) Labs Reviewed - No data to display  EKG None  Radiology No results found.  Procedures Procedures (including critical care time)  Medications Ordered in ED Medications - No data to display   Initial Impression / Assessment and Plan / ED Course  I have reviewed the triage vital signs and the nursing notes.  Pertinent labs & imaging results that were available during my care of the patient were reviewed by me and considered in my medical decision making (see chart for details).        Patient with rash after sitting in the cough chair.  Will cover for scabies, however could be bedbug bites or soley contact dermatitis.  Will also treat with hydrocortisone cream and recommend Benadryl over-the-counter.  No signs of secondary infection.  Return precautions discussed.  Patient understands and agrees with plan.  Patient vitals stable throughout ED course and discharged in satisfactory condition.  Final Clinical Impressions(s) / ED Diagnoses   Final diagnoses:  Rash and nonspecific skin eruption    ED Discharge Orders         Ordered    permethrin (ELIMITE) 5 % cream     06/18/19 1703    hydrocortisone 2.5 % lotion  2 times daily     06/18/19 [address]1703           Shivangi Lutz M, PA-C 06/18/19 1706    Gwyneth SproutPlunkett, Whitney, MD 06/18/19 2333

## 2019-12-12 ENCOUNTER — Emergency Department (HOSPITAL_BASED_OUTPATIENT_CLINIC_OR_DEPARTMENT_OTHER): Payer: Medicaid Other

## 2019-12-12 ENCOUNTER — Emergency Department (HOSPITAL_BASED_OUTPATIENT_CLINIC_OR_DEPARTMENT_OTHER)
Admission: EM | Admit: 2019-12-12 | Discharge: 2019-12-12 | Disposition: A | Discharge status: Home / Self Care | Payer: Medicaid Other | Attending: Emergency Medicine | Admitting: Emergency Medicine

## 2019-12-12 ENCOUNTER — Encounter (HOSPITAL_BASED_OUTPATIENT_CLINIC_OR_DEPARTMENT_OTHER): Payer: Self-pay | Admitting: Student

## 2019-12-12 ENCOUNTER — Other Ambulatory Visit: Payer: Self-pay

## 2019-12-12 DIAGNOSIS — S4992XA Unspecified injury of left shoulder and upper arm, initial encounter: Secondary | ICD-10-CM | POA: Diagnosis not present

## 2019-12-12 DIAGNOSIS — M25512 Pain in left shoulder: Secondary | ICD-10-CM | POA: Insufficient documentation

## 2019-12-12 DIAGNOSIS — F17210 Nicotine dependence, cigarettes, uncomplicated: Secondary | ICD-10-CM | POA: Diagnosis not present

## 2019-12-12 MED ORDER — METHOCARBAMOL 500 MG PO TABS: 500.0000 mg | ORAL_TABLET | Freq: Three times a day (TID) | ORAL | 0 refills | Status: DC | PRN

## 2019-12-12 MED ORDER — NAPROXEN 500 MG PO TABS: 500.0000 mg | ORAL_TABLET | Freq: Two times a day (BID) | ORAL | 0 refills | Status: DC
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
## 2019-12-12 NOTE — ED Notes (Signed)
ED Provider at bedside. 
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
## 2019-12-16 ENCOUNTER — Ambulatory Visit: Payer: Medicaid Other | Admitting: Family Medicine

## 2019-12-16 ENCOUNTER — Ambulatory Visit: Payer: Self-pay

## 2019-12-16 ENCOUNTER — Other Ambulatory Visit: Payer: Self-pay

## 2019-12-16 ENCOUNTER — Encounter: Payer: Self-pay | Admitting: Family Medicine

## 2019-12-16 VITALS — BP 127/83 | HR 69 | Ht 63.0 in | Wt 115.0 lb

## 2019-12-16 DIAGNOSIS — M546 Pain in thoracic spine: Secondary | ICD-10-CM

## 2019-12-16 DIAGNOSIS — M542 Cervicalgia: Secondary | ICD-10-CM | POA: Diagnosis not present

## 2019-12-16 DIAGNOSIS — M25512 Pain in left shoulder: Secondary | ICD-10-CM

## 2019-12-16 DIAGNOSIS — S42202A Unspecified fracture of upper end of left humerus, initial encounter for closed fracture: Secondary | ICD-10-CM | POA: Diagnosis not present

## 2019-12-16 NOTE — Assessment & Plan Note (Signed)
No suggestion of fracture of the ribs.  Likely some spasm in this area.   - Counseled on home exercise therapy and supportive care. -Consider physical therapy or imaging.
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
## 2019-12-16 NOTE — Assessment & Plan Note (Signed)
Has some stiffness and is likely spasm in nature. -Counseled on home exercise therapy and supportive care. -Can consider imaging or physical therapy.

## 2019-12-16 NOTE — Patient Instructions (Signed)
Nice to meet you Please continue the sling  Please try ice  Please call [facility] to schedule the MRI in a couple of days   Please send me a message in MyChart with any questions or updates.  We will set a virtual visit once the results are in.   --Dr. Jordan Likes
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
## 2019-12-23 ENCOUNTER — Other Ambulatory Visit: Payer: Self-pay | Admitting: Family Medicine

## 2019-12-23 ENCOUNTER — Ambulatory Visit (HOSPITAL_BASED_OUTPATIENT_CLINIC_OR_DEPARTMENT_OTHER)
Admission: RE | Admit: 2019-12-23 | Discharge: 2019-12-23 | Disposition: A | Discharge status: Home / Self Care | Payer: Medicaid Other | Source: Ambulatory Visit | Attending: Family Medicine | Admitting: Family Medicine

## 2019-12-23 ENCOUNTER — Other Ambulatory Visit: Payer: Self-pay

## 2019-12-23 DIAGNOSIS — M19012 Primary osteoarthritis, left shoulder: Secondary | ICD-10-CM | POA: Diagnosis not present

## 2019-12-23 DIAGNOSIS — S42202D Unspecified fracture of upper end of left humerus, subsequent encounter for fracture with routine healing: Secondary | ICD-10-CM | POA: Insufficient documentation

## 2019-12-24 ENCOUNTER — Telehealth: Payer: Self-pay | Admitting: Family Medicine

## 2019-12-24 NOTE — Telephone Encounter (Signed)
Informed of results.   Myra Rude, MD [facility] 12/24/2019, 8:47 AM

## 2019-12-28 ENCOUNTER — Other Ambulatory Visit: Payer: Medicaid Other

## 2020-03-31 ENCOUNTER — Other Ambulatory Visit: Payer: Medicaid Other

## 2020-08-11 ENCOUNTER — Emergency Department (HOSPITAL_BASED_OUTPATIENT_CLINIC_OR_DEPARTMENT_OTHER): Payer: No Typology Code available for payment source

## 2020-08-11 ENCOUNTER — Encounter (HOSPITAL_BASED_OUTPATIENT_CLINIC_OR_DEPARTMENT_OTHER): Payer: Self-pay | Admitting: Emergency Medicine

## 2020-08-11 ENCOUNTER — Emergency Department (HOSPITAL_BASED_OUTPATIENT_CLINIC_OR_DEPARTMENT_OTHER)
Admission: EM | Admit: 2020-08-11 | Discharge: 2020-08-11 | Disposition: A | Discharge status: Home / Self Care | Payer: No Typology Code available for payment source | Attending: Emergency Medicine | Admitting: Emergency Medicine

## 2020-08-11 ENCOUNTER — Other Ambulatory Visit: Payer: Self-pay

## 2020-08-11 DIAGNOSIS — Y92410 Unspecified street and highway as the place of occurrence of the external cause: Secondary | ICD-10-CM | POA: Insufficient documentation

## 2020-08-11 DIAGNOSIS — S161XXA Strain of muscle, fascia and tendon at neck level, initial encounter: Secondary | ICD-10-CM | POA: Insufficient documentation

## 2020-08-11 DIAGNOSIS — F17210 Nicotine dependence, cigarettes, uncomplicated: Secondary | ICD-10-CM | POA: Diagnosis not present

## 2020-08-11 DIAGNOSIS — S3992XA Unspecified injury of lower back, initial encounter: Secondary | ICD-10-CM | POA: Diagnosis present

## 2020-08-11 DIAGNOSIS — M62838 Other muscle spasm: Secondary | ICD-10-CM | POA: Diagnosis not present

## 2020-08-11 DIAGNOSIS — M545 Low back pain, unspecified: Secondary | ICD-10-CM | POA: Diagnosis not present

## 2020-08-11 DIAGNOSIS — S39012A Strain of muscle, fascia and tendon of lower back, initial encounter: Secondary | ICD-10-CM | POA: Diagnosis not present

## 2020-08-11 DIAGNOSIS — M542 Cervicalgia: Secondary | ICD-10-CM | POA: Diagnosis not present

## 2020-08-11 LAB — PREGNANCY, URINE: Preg Test, Ur: NEGATIVE

## 2020-08-11 MED ORDER — NAPROXEN 250 MG PO TABS
500.0000 mg | ORAL_TABLET | Freq: Once | ORAL | Status: AC
  Administered 2020-08-11: 500 mg via ORAL
  Filled 2020-08-11: qty 2

## 2020-08-11 MED ORDER — CYCLOBENZAPRINE HCL 10 MG PO TABS: 10.0000 mg | ORAL_TABLET | Freq: Three times a day (TID) | ORAL | 0 refills | Status: DC | PRN

## 2020-08-11 MED ORDER — CYCLOBENZAPRINE HCL 10 MG PO TABS
10.0000 mg | ORAL_TABLET | Freq: Once | ORAL | Status: AC
  Administered 2020-08-11: 10 mg via ORAL
  Filled 2020-08-11: qty 1

## 2020-08-11 MED ORDER — NAPROXEN 375 MG PO TABS: ORAL_TABLET | ORAL | 0 refills | Status: DC
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
## 2020-10-26 ENCOUNTER — Encounter (HOSPITAL_BASED_OUTPATIENT_CLINIC_OR_DEPARTMENT_OTHER): Payer: Self-pay

## 2020-10-26 ENCOUNTER — Emergency Department (HOSPITAL_BASED_OUTPATIENT_CLINIC_OR_DEPARTMENT_OTHER)
Admission: EM | Admit: 2020-10-26 | Discharge: 2020-10-26 | Disposition: A | Discharge status: Home / Self Care | Payer: Medicaid Other | Attending: Emergency Medicine | Admitting: Emergency Medicine

## 2020-10-26 ENCOUNTER — Other Ambulatory Visit: Payer: Self-pay

## 2020-10-26 DIAGNOSIS — R112 Nausea with vomiting, unspecified: Secondary | ICD-10-CM | POA: Diagnosis not present

## 2020-10-26 DIAGNOSIS — R197 Diarrhea, unspecified: Secondary | ICD-10-CM | POA: Insufficient documentation

## 2020-10-26 DIAGNOSIS — F17210 Nicotine dependence, cigarettes, uncomplicated: Secondary | ICD-10-CM | POA: Diagnosis not present

## 2020-10-26 DIAGNOSIS — R519 Headache, unspecified: Secondary | ICD-10-CM | POA: Insufficient documentation

## 2020-10-26 DIAGNOSIS — R070 Pain in throat: Secondary | ICD-10-CM | POA: Diagnosis not present

## 2020-10-26 LAB — LIPASE, BLOOD: Lipase: 30 U/L (ref 11–51)

## 2020-10-26 LAB — URINALYSIS, ROUTINE W REFLEX MICROSCOPIC
Bilirubin Urine: NEGATIVE
Glucose, UA: NEGATIVE mg/dL
Hgb urine dipstick: NEGATIVE
Ketones, ur: 80 mg/dL — AB
Leukocytes,Ua: NEGATIVE
Nitrite: NEGATIVE
Protein, ur: NEGATIVE mg/dL
Specific Gravity, Urine: 1.025 (ref 1.005–1.030)
pH: 6 (ref 5.0–8.0)

## 2020-10-26 LAB — COMPREHENSIVE METABOLIC PANEL
ALT: 15 U/L (ref 0–44)
AST: 21 U/L (ref 15–41)
Albumin: 4.3 g/dL (ref 3.5–5.0)
Alkaline Phosphatase: 50 U/L (ref 38–126)
Anion gap: 7 (ref 5–15)
BUN: 10 mg/dL (ref 6–20)
CO2: 29 mmol/L (ref 22–32)
Calcium: 8.3 mg/dL — ABNORMAL LOW (ref 8.9–10.3)
Chloride: 103 mmol/L (ref 98–111)
Creatinine, Ser: 0.76 mg/dL (ref 0.44–1.00)
GFR, Estimated: >60 mL/min (ref >60)
Glucose, Bld: 88 mg/dL (ref 70–99)
Potassium: 3.5 mmol/L (ref 3.5–5.1)
Sodium: 139 mmol/L (ref 135–145)
Total Bilirubin: 0.5 mg/dL (ref 0.3–1.2)
Total Protein: 7.2 g/dL (ref 6.5–8.1)

## 2020-10-26 LAB — CBC WITH DIFFERENTIAL/PLATELET
Abs Immature Granulocytes: 0 10*3/uL (ref 0.00–0.07)
Basophils Absolute: 0 10*3/uL (ref 0.0–0.1)
Basophils Relative: 1 %
Eosinophils Absolute: 0.3 10*3/uL (ref 0.0–0.5)
Eosinophils Relative: 12 %
HCT: 43.6 % (ref 36.0–46.0)
Hemoglobin: 14.6 g/dL (ref 12.0–15.0)
Immature Granulocytes: 0 %
Lymphocytes Relative: 41 %
Lymphs Abs: 0.9 10*3/uL (ref 0.7–4.0)
MCH: 30.3 pg (ref 26.0–34.0)
MCHC: 33.5 g/dL (ref 30.0–36.0)
MCV: 90.5 fL (ref 80.0–100.0)
Monocytes Absolute: 0.3 10*3/uL (ref 0.1–1.0)
Monocytes Relative: 13 %
Neutro Abs: 0.7 10*3/uL — ABNORMAL LOW (ref 1.7–7.7)
Neutrophils Relative %: 33 %
Platelets: 211 10*3/uL (ref 150–400)
RBC: 4.82 MIL/uL (ref 3.87–5.11)
RDW: 13.4 % (ref 11.5–15.5)
Smear Review: NORMAL
WBC: 2.2 10*3/uL — ABNORMAL LOW (ref 4.0–10.5)
nRBC: 0 % (ref 0.0–0.2)

## 2020-10-26 LAB — PREGNANCY, URINE: Preg Test, Ur: NEGATIVE

## 2020-10-26 MED ORDER — SODIUM CHLORIDE 0.9 % IV BOLUS
1000.0000 mL | Freq: Once | INTRAVENOUS | Status: AC
  Administered 2020-10-26: 1000 mL via INTRAVENOUS

## 2020-10-26 MED ORDER — ONDANSETRON HCL 4 MG/2ML IJ SOLN
4.0000 mg | Freq: Once | INTRAMUSCULAR | Status: AC
  Administered 2020-10-26: 4 mg via INTRAVENOUS
  Filled 2020-10-26: qty 2

## 2020-10-26 MED ORDER — ONDANSETRON 4 MG PO TBDP: 4.0000 mg | ORAL_TABLET | Freq: Three times a day (TID) | ORAL | 0 refills | Status: AC | PRN

## 2020-10-26 NOTE — ED Notes (Signed)
ED Provider at bedside. 

## 2020-10-26 NOTE — ED Triage Notes (Signed)
Headache, nausea, vomiting, dizziness, sore throat x 4 days.  Denies abdominal pain

## 2020-10-26 NOTE — ED Provider Notes (Signed)
[facility] EMERGENCY DEPARTMENT Provider Note  CSN: [id number] Arrival date & time: 10/26/20 0749    History Chief Complaint  Patient presents with  . Emesis    HPI  Heather Maddox is a 40 y.o. female with no significant PMH reports 4 days of persistent vomiting, bilious non blood liquid, last episode was this morning. She reports associated diffuse headache, sore throat and occasional diarrhea. Not much abdominal discomfort. No history of same. She has not had a fever or cough. She denies EtOH but does smoke marijuana "all day".    History reviewed. No pertinent past medical history.  Past Surgical History:  Procedure Laterality Date  . OTHER SURGICAL HISTORY      birthmark removed from forehead age 7    History reviewed. No pertinent family history.  Social History   Tobacco Use  . Smoking status: Current Every Day Smoker    Packs/day: 0.20    Years: 21.00    Pack years: 4.20    Types: Cigarettes  . Smokeless tobacco: Never Used  Vaping Use  . Vaping Use: Never used  Substance Use Topics  . Alcohol use: Not Currently    Comment: occ  . Drug use: Not Currently    Types: Marijuana     Home Medications Prior to Admission medications   Medication Sig Start Date End Date Taking? Authorizing Provider  ondansetron (ZOFRAN ODT) 4 MG disintegrating tablet Take 1 tablet (4 mg total) by mouth every 8 (eight) hours as needed for nausea or vomiting. 10/26/20  Yes Pollyann Savoy, MD     Allergies    Patient has no known allergies.   Review of Systems   Review of Systems A comprehensive review of systems was completed and negative except as noted in HPI.    Physical Exam BP 129/70 (BP Location: Left Arm)   Pulse (!) 51   Temp 98.2 F (36.8 C) (Oral)   Resp 16   Ht 5\' 2"  (1.575 m)   Wt 49.9 kg   LMP 09/29/2020   SpO2 100%   BMI 20.12 kg/m   Physical Exam Vitals and nursing note reviewed.  Constitutional:      Appearance: Normal  appearance.  HENT:     Head: Normocephalic and atraumatic.     Nose: Nose normal.     Mouth/Throat:     Mouth: Mucous membranes are dry.  Eyes:     Extraocular Movements: Extraocular movements intact.     Conjunctiva/sclera: Conjunctivae normal.  Cardiovascular:     Rate and Rhythm: Normal rate.  Pulmonary:     Effort: Pulmonary effort is normal.     Breath sounds: Normal breath sounds.  Abdominal:     General: Abdomen is flat.     Palpations: Abdomen is soft.     Tenderness: There is no abdominal tenderness. There is no guarding.  Musculoskeletal:        General: No swelling. Normal range of motion.     Cervical back: Neck supple.  Skin:    General: Skin is warm and dry.  Neurological:     General: No focal deficit present.     Mental Status: She is alert.  Psychiatric:        Mood and Affect: Mood normal.      ED Results / Procedures / Treatments   Labs (all labs ordered are listed, but only abnormal results are displayed) Labs Reviewed  COMPREHENSIVE METABOLIC PANEL - Abnormal; Notable for the following components:  Result Value   Calcium 8.3 (*)    All other components within normal limits  CBC WITH DIFFERENTIAL/PLATELET - Abnormal; Notable for the following components:   WBC 2.2 (*)    Neutro Abs 0.7 (*)    All other components within normal limits  URINALYSIS, ROUTINE W REFLEX MICROSCOPIC - Abnormal; Notable for the following components:   Ketones, ur 80 (*)    All other components within normal limits  LIPASE, BLOOD  PREGNANCY, URINE  PATHOLOGIST SMEAR REVIEW    EKG None   Radiology No results found.  Procedures Procedures  Medications Ordered in the ED Medications  sodium chloride 0.9 % bolus 1,000 mL (0 mLs Intravenous Stopped 10/26/20 0956)  ondansetron (ZOFRAN) injection 4 mg (4 mg Intravenous Given 10/26/20 0846)     MDM Rules/Calculators/A&P MDM Patient appears dry on exam. Will check labs and give IVF and antiemetics. She does not  have a history of similar episodes, but was cautioned that excessive marijuana use can lead to recurrent issues with abdominal pain and vomiting.   ED Course  I have reviewed the triage vital signs and the nursing notes.  Pertinent labs & imaging results that were available during my care of the patient were reviewed by me and considered in my medical decision making (see chart for details).  Clinical Course as of 10/26/20 1059  Tue Oct 26, 2020  0920 CMP and lipase are normal.  [CS]  1003 Patient's CBC with mild leukopenia, diff is pending. She reports she's feeling much better now and has an appetite. Will give PO trial, still awaiting UA.  [CS]  1057 UA with ketones but otherwise normal.  [CS]  1058 Patient tolerating PO and is ready to go home. Rx for Zofran. Advised to stop Marijuana use. RTED for any other concerns.  [CS]    Clinical Course User Index [CS] Pollyann Savoy, MD    Final Clinical Impression(s) / ED Diagnoses Final diagnoses:  Nausea vomiting and diarrhea    Rx / DC Orders ED Discharge Orders         Ordered    ondansetron (ZOFRAN ODT) 4 MG disintegrating tablet  Every 8 hours PRN        10/26/20 1059           Pollyann Savoy, MD 10/26/20 1059

## 2020-10-28 LAB — PATHOLOGIST SMEAR REVIEW

## 2021-05-16 DIAGNOSIS — Z419 Encounter for procedure for purposes other than remedying health state, unspecified: Secondary | ICD-10-CM | POA: Diagnosis not present

## 2021-06-16 DIAGNOSIS — Z419 Encounter for procedure for purposes other than remedying health state, unspecified: Secondary | ICD-10-CM | POA: Diagnosis not present

## 2021-07-16 DIAGNOSIS — Z419 Encounter for procedure for purposes other than remedying health state, unspecified: Secondary | ICD-10-CM | POA: Diagnosis not present

## 2021-08-16 DIAGNOSIS — Z419 Encounter for procedure for purposes other than remedying health state, unspecified: Secondary | ICD-10-CM | POA: Diagnosis not present

## 2021-09-15 DIAGNOSIS — Z419 Encounter for procedure for purposes other than remedying health state, unspecified: Secondary | ICD-10-CM | POA: Diagnosis not present

## 2021-09-22 ENCOUNTER — Other Ambulatory Visit: Payer: Self-pay

## 2021-09-22 ENCOUNTER — Encounter (HOSPITAL_BASED_OUTPATIENT_CLINIC_OR_DEPARTMENT_OTHER): Payer: Self-pay

## 2021-09-22 ENCOUNTER — Emergency Department (HOSPITAL_BASED_OUTPATIENT_CLINIC_OR_DEPARTMENT_OTHER)
Admission: EM | Admit: 2021-09-22 | Discharge: 2021-09-22 | Disposition: A | Discharge status: Home / Self Care | Payer: Medicaid Other | Attending: Emergency Medicine | Admitting: Emergency Medicine

## 2021-09-22 DIAGNOSIS — F17210 Nicotine dependence, cigarettes, uncomplicated: Secondary | ICD-10-CM | POA: Diagnosis not present

## 2021-09-22 DIAGNOSIS — U071 COVID-19: Secondary | ICD-10-CM | POA: Diagnosis not present

## 2021-09-22 DIAGNOSIS — R9431 Abnormal electrocardiogram [ECG] [EKG]: Secondary | ICD-10-CM | POA: Diagnosis not present

## 2021-09-22 DIAGNOSIS — R519 Headache, unspecified: Secondary | ICD-10-CM | POA: Diagnosis present

## 2021-09-22 LAB — RESP PANEL BY RT-PCR (FLU A&B, COVID) ARPGX2
Influenza A by PCR: NEGATIVE
Influenza B by PCR: NEGATIVE
SARS Coronavirus 2 by RT PCR: POSITIVE — AB

## 2021-09-22 NOTE — ED Provider Notes (Signed)
[facility] EMERGENCY DEPARTMENT Provider Note   CSN: [id number] Arrival date & time: 09/22/21  2003     History Chief Complaint  Patient presents with   Headache    Heather Maddox is a 40 y.o. female.  Patient is a 40 year old female who presents with headache associated with itchy eyes, nausea and body aches.  She had 1 episode of vomiting earlier today but is tolerating fluids since then.  Her daughter tested positive for COVID today.  Her symptoms started yesterday.  No shortness of breath.  No diarrhea.      History reviewed. No pertinent past medical history.  Patient Active Problem List   Diagnosis Date Noted   Closed fracture of left proximal humerus 12/16/2019   Neck pain 12/16/2019   Acute bilateral thoracic back pain 12/16/2019    Past Surgical History:  Procedure Laterality Date   OTHER SURGICAL HISTORY      birthmark removed from forehead age 35     OB History     Gravida  3   Para  2   Term  2   Preterm  0   AB  1   Living  2      SAB  1   IAB  0   Ectopic  0   Multiple  0   Live Births  2           History reviewed. No pertinent family history.  Social History   Tobacco Use   Smoking status: Every Day    Packs/day: 0.20    Years: 21.00    Pack years: 4.20    Types: Cigarettes   Smokeless tobacco: Never  Vaping Use   Vaping Use: Never used  Substance Use Topics   Alcohol use: Not Currently    Comment: occ   Drug use: Not Currently    Types: Marijuana    Home Medications Prior to Admission medications   Medication Sig Start Date End Date Taking? Authorizing Provider  ondansetron (ZOFRAN ODT) 4 MG disintegrating tablet Take 1 tablet (4 mg total) by mouth every 8 (eight) hours as needed for nausea or vomiting. 10/26/20   Pollyann Savoy, MD    Allergies    Patient has no known allergies.  Review of Systems   Review of Systems  Constitutional:  Positive for fatigue. Negative for chills, diaphoresis  and fever.  HENT:  Negative for congestion, rhinorrhea and sneezing.   Eyes:  Positive for itching.  Respiratory:  Negative for cough, chest tightness and shortness of breath.   Cardiovascular:  Negative for chest pain and leg swelling.  Gastrointestinal:  Positive for nausea and vomiting. Negative for abdominal pain, blood in stool and diarrhea.  Genitourinary:  Negative for difficulty urinating, flank pain, frequency and hematuria.  Musculoskeletal:  Positive for myalgias. Negative for arthralgias and back pain.  Skin:  Negative for rash.  Neurological:  Positive for headaches. Negative for dizziness, speech difficulty, weakness and numbness.   Physical Exam Updated Vital Signs BP 131/68 (BP Location: Right Arm)   Pulse 60   Temp 99 F (37.2 C) (Oral)   Resp 17   Ht 5\' 2"  (1.575 m)   Wt 56.7 kg   LMP 09/15/2021 (Approximate)   SpO2 100%   BMI 22.86 kg/m   Physical Exam Constitutional:      Appearance: She is well-developed.  HENT:     Head: Normocephalic and atraumatic.  Eyes:     Pupils: Pupils are equal, round,  and reactive to light.  Cardiovascular:     Rate and Rhythm: Normal rate and regular rhythm.     Heart sounds: Normal heart sounds.  Pulmonary:     Effort: Pulmonary effort is normal. No respiratory distress.     Breath sounds: Normal breath sounds. No wheezing or rales.  Chest:     Chest wall: No tenderness.  Abdominal:     General: Bowel sounds are normal.     Palpations: Abdomen is soft.     Tenderness: There is no abdominal tenderness. There is no guarding or rebound.  Musculoskeletal:        General: Normal range of motion.     Cervical back: Normal range of motion and neck supple.  Lymphadenopathy:     Cervical: No cervical adenopathy.  Skin:    General: Skin is warm and dry.     Findings: No rash.  Neurological:     Mental Status: She is alert and oriented to person, place, and time.    ED Results / Procedures / Treatments   Labs (all labs  ordered are listed, but only abnormal results are displayed) Labs Reviewed  RESP PANEL BY RT-PCR (FLU A&B, COVID) ARPGX2 - Abnormal; Notable for the following components:      Result Value   SARS Coronavirus 2 by RT PCR POSITIVE (*)    All other components within normal limits    EKG EKG Interpretation  Date/Time:  Thursday September 22 2021 20:19:11 EST Ventricular Rate:  82 PR Interval:  146 QRS Duration: 82 QT Interval:  378 QTC Calculation: 441 R Axis:   106 Text Interpretation: Normal sinus rhythm Rightward axis Borderline ECG Confirmed by Malvin Johns [phone]) on 09/22/2021 10:48:58 PM  Radiology No results found.  Procedures Procedures   Medications Ordered in ED Medications - No data to display  ED Course  I have reviewed the triage vital signs and the nursing notes.  Pertinent labs & imaging results that were available during my care of the patient were reviewed by me and considered in my medical decision making (see chart for details).    MDM Rules/Calculators/A&P                           Patient's COVID test is positive.  She is otherwise well-appearing.  She was given symptomatic care instructions, quarantine instructions and return precautions. Final Clinical Impression(s) / ED Diagnoses Final diagnoses:  COVID-19 virus infection    Rx / DC Orders ED Discharge Orders     None        Malvin Johns, MD 09/22/21 2250

## 2021-09-22 NOTE — ED Triage Notes (Addendum)
Pt states daughter tested positive for COVID today. C/o headache x 2 days, eye pain, vomiting, tingling to left side starting today around 1000. Took tylenol today without relief

## 2021-09-23 ENCOUNTER — Telehealth: Payer: Self-pay

## 2021-09-23 NOTE — Telephone Encounter (Signed)
Transition Care Management Unsuccessful Follow-up Telephone Call  Date of discharge and from where:  09/22/2021-[facility]   Attempts:  1st Attempt  Reason for unsuccessful TCM follow-up call:  Unable to reach patient  Wrong Number

## 2021-09-26 NOTE — Telephone Encounter (Signed)
Transition Care Management Unsuccessful Follow-up Telephone Call  Date of discharge and from where:  09/22/2021 from [facility]  Attempts:  2nd Attempt  Reason for unsuccessful TCM follow-up call:  Missing or invalid number

## 2021-09-27 NOTE — Telephone Encounter (Signed)
Transition Care Management Unsuccessful Follow-up Telephone Call  Date of discharge and from where:  09/22/2021 from [facility]  Attempts:  3rd Attempt  Reason for unsuccessful TCM follow-up call:  Missing or invalid number

## 2021-10-16 DIAGNOSIS — Z419 Encounter for procedure for purposes other than remedying health state, unspecified: Secondary | ICD-10-CM | POA: Diagnosis not present

## 2021-11-16 DIAGNOSIS — Z419 Encounter for procedure for purposes other than remedying health state, unspecified: Secondary | ICD-10-CM | POA: Diagnosis not present

## 2021-12-14 DIAGNOSIS — Z419 Encounter for procedure for purposes other than remedying health state, unspecified: Secondary | ICD-10-CM | POA: Diagnosis not present

## 2022-01-14 DIAGNOSIS — Z419 Encounter for procedure for purposes other than remedying health state, unspecified: Secondary | ICD-10-CM | POA: Diagnosis not present

## 2022-02-13 DIAGNOSIS — Z419 Encounter for procedure for purposes other than remedying health state, unspecified: Secondary | ICD-10-CM | POA: Diagnosis not present

## 2022-03-16 DIAGNOSIS — Z419 Encounter for procedure for purposes other than remedying health state, unspecified: Secondary | ICD-10-CM | POA: Diagnosis not present

## 2022-04-15 DIAGNOSIS — Z419 Encounter for procedure for purposes other than remedying health state, unspecified: Secondary | ICD-10-CM | POA: Diagnosis not present

## 2022-04-16 DIAGNOSIS — Z114 Encounter for screening for human immunodeficiency virus [HIV]: Secondary | ICD-10-CM | POA: Diagnosis not present

## 2022-04-16 DIAGNOSIS — R634 Abnormal weight loss: Secondary | ICD-10-CM | POA: Diagnosis not present

## 2022-04-16 DIAGNOSIS — Z7251 High risk heterosexual behavior: Secondary | ICD-10-CM | POA: Diagnosis not present

## 2022-04-16 DIAGNOSIS — Z6820 Body mass index (BMI) 20.0-20.9, adult: Secondary | ICD-10-CM | POA: Diagnosis not present

## 2022-04-16 DIAGNOSIS — K219 Gastro-esophageal reflux disease without esophagitis: Secondary | ICD-10-CM | POA: Diagnosis not present

## 2022-04-16 DIAGNOSIS — F17210 Nicotine dependence, cigarettes, uncomplicated: Secondary | ICD-10-CM | POA: Diagnosis not present

## 2022-05-16 DIAGNOSIS — Z419 Encounter for procedure for purposes other than remedying health state, unspecified: Secondary | ICD-10-CM | POA: Diagnosis not present

## 2022-05-28 ENCOUNTER — Emergency Department (HOSPITAL_BASED_OUTPATIENT_CLINIC_OR_DEPARTMENT_OTHER)
Admission: EM | Admit: 2022-05-28 | Discharge: 2022-05-28 | Disposition: A | Discharge status: Home / Self Care | Payer: Medicaid Other | Attending: Emergency Medicine | Admitting: Emergency Medicine

## 2022-05-28 ENCOUNTER — Emergency Department (HOSPITAL_BASED_OUTPATIENT_CLINIC_OR_DEPARTMENT_OTHER): Payer: Medicaid Other

## 2022-05-28 ENCOUNTER — Encounter (HOSPITAL_BASED_OUTPATIENT_CLINIC_OR_DEPARTMENT_OTHER): Payer: Self-pay | Admitting: Emergency Medicine

## 2022-05-28 ENCOUNTER — Other Ambulatory Visit: Payer: Self-pay

## 2022-05-28 DIAGNOSIS — S93401A Sprain of unspecified ligament of right ankle, initial encounter: Secondary | ICD-10-CM | POA: Diagnosis not present

## 2022-05-28 DIAGNOSIS — W19XXXA Unspecified fall, initial encounter: Secondary | ICD-10-CM | POA: Diagnosis not present

## 2022-05-28 DIAGNOSIS — S99911A Unspecified injury of right ankle, initial encounter: Secondary | ICD-10-CM | POA: Diagnosis present

## 2022-05-28 MED ORDER — ACETAMINOPHEN 500 MG PO TABS
1000.0000 mg | ORAL_TABLET | Freq: Once | ORAL | Status: AC
  Administered 2022-05-28: 1000 mg via ORAL
  Filled 2022-05-28: qty 2

## 2022-05-28 MED ORDER — IBUPROFEN 400 MG PO TABS
400.0000 mg | ORAL_TABLET | Freq: Once | ORAL | Status: AC
  Administered 2022-05-28: 400 mg via ORAL
  Filled 2022-05-28: qty 1

## 2022-05-28 NOTE — ED Notes (Signed)
Patient has swelling in her rt feet. Patient is not able to move her rt feet. Alert x4. Reports some numbness

## 2022-05-28 NOTE — ED Provider Notes (Signed)
[facility] EMERGENCY DEPARTMENT Provider Note   CSN: [id number] Arrival date & time: 05/28/22  1208     History  Chief Complaint  Patient presents with   Leg Injury    Heather Maddox is a 41 y.o. female.  Pt is a healthy 41y/o female presenting today due to persisent right ankle/foot pain after a fall at 5am this morning.  She was going down the stairs to go to work and missed the last step causing her to twist her right ankle and fall to the floor.  She denies hitting her head or LOC.  She was unable to get up at first because it was so painful.  She attempted to go to work but was in too much pain.  She is unable to move her toes due to pain and reports they feel tingling and numb.  No knee or hip pain.  The history is provided by the patient.       Home Medications Prior to Admission medications   Medication Sig Start Date End Date Taking? Authorizing Provider  ondansetron (ZOFRAN ODT) 4 MG disintegrating tablet Take 1 tablet (4 mg total) by mouth every 8 (eight) hours as needed for nausea or vomiting. 10/26/20   Pollyann Savoy, MD      Allergies    Patient has no known allergies.    Review of Systems   Review of Systems  Physical Exam Updated Vital Signs BP (!) 133/98   Pulse 68   Temp 97.6 F (36.4 C) (Oral)   Resp 18   LMP 05/07/2022 (Approximate)   SpO2 100%  Physical Exam Vitals and nursing note reviewed.  Constitutional:      Appearance: Normal appearance.  Musculoskeletal:     Right hip: Normal.     Right knee: Normal. No swelling or deformity. Normal range of motion. No tenderness. No lateral joint line tenderness.     Right ankle: No swelling or deformity. Tenderness present over the lateral malleolus, ATF ligament, CF ligament, posterior TF ligament and base of 5th metatarsal. No medial malleolus or proximal fibula tenderness. Decreased range of motion.     Right Achilles Tendon: Tenderness present. No defects.        Feet:  Neurological:     Mental Status: She is alert.     ED Results / Procedures / Treatments   Labs (all labs ordered are listed, but only abnormal results are displayed) Labs Reviewed - No data to display  EKG None  Radiology DG Ankle Complete Right  Result Date: 05/28/2022 CLINICAL DATA:  fall, ankle pain EXAM: RIGHT ANKLE - COMPLETE 3+ VIEW COMPARISON:  None Available. FINDINGS: There is no evidence of fracture, dislocation, or joint effusion. There is no evidence of arthropathy or other focal bone abnormality. Soft tissues are unremarkable. IMPRESSION: Negative. Electronically Signed   By: Marjo Bicker M.D.   On: 05/28/2022 12:54    Procedures Procedures    Medications Ordered in ED Medications  ibuprofen (ADVIL) tablet 400 mg (400 mg Oral Given 05/28/22 1236)  acetaminophen (TYLENOL) tablet 1,000 mg (1,000 mg Oral Given 05/28/22 1236)    ED Course/ Medical Decision Making/ A&P                           Medical Decision Making Amount and/or Complexity of Data Reviewed Radiology: ordered and independent interpretation performed. Decision-making details documented in ED Course.  Risk OTC drugs. Prescription drug management.   Pt  presenting today after mechanical fall and persistent right ankle pain.  Pt reports some numbness and tingling in the right toes but severe pain when trying to move the foot.  She has significant ankle and foot tenderness but normal pulse and cap refill.  Concern for severe sprain vs fracture.  No findings to suggest [place]uve fx.  No knee or hip pain.  Feel that pain is limiting pt's ROM not ligament dysfunction.  Pt given oral pain control and x-ray pending.  1:26 PM I have independently visualized and interpreted pt's images today.  Ankle imaging today is neg.  Discussed with the pt and clear for d/c and supportive care.         Final Clinical Impression(s) / ED Diagnoses Final diagnoses:  Sprain of right ankle, unspecified  ligament, initial encounter    Rx / DC Orders ED Discharge Orders     None         Gwyneth Sprout, MD 05/28/22 1326

## 2022-05-28 NOTE — ED Triage Notes (Addendum)
Patient states she was coming down stairs and missed bottom step.and fell to floor.Patient is having pain in her rt leg.Report numbness in leg

## 2022-06-01 ENCOUNTER — Ambulatory Visit (INDEPENDENT_AMBULATORY_CARE_PROVIDER_SITE_OTHER): Payer: Medicaid Other | Admitting: Family Medicine

## 2022-06-01 ENCOUNTER — Encounter: Payer: Self-pay | Admitting: Family Medicine

## 2022-06-01 ENCOUNTER — Ambulatory Visit: Payer: Self-pay

## 2022-06-01 VITALS — BP 110/76 | Ht 64.0 in | Wt 125.0 lb

## 2022-06-01 DIAGNOSIS — S93409A Sprain of unspecified ligament of unspecified ankle, initial encounter: Secondary | ICD-10-CM | POA: Insufficient documentation

## 2022-06-01 DIAGNOSIS — M25571 Pain in right ankle and joints of right foot: Secondary | ICD-10-CM

## 2022-06-01 DIAGNOSIS — S92144A Nondisplaced dome fracture of right talus, initial encounter for closed fracture: Secondary | ICD-10-CM | POA: Insufficient documentation

## 2022-06-01 NOTE — Progress Notes (Signed)
  Heather Maddox - 41 y.o. female MRN [id number]  Date of birth: 1981-03-31  SUBJECTIVE:  Including CC & ROS.  No chief complaint on file.   Heather Maddox is a 41 y.o. female that is presenting with acute right ankle pain.  She was walking down the steps a few days ago and missed the last 2 steps.  She had an inversion injury.  Unable to bear weight.  She has limited range of motion of the ankle.  Having severe pain with weightbearing.  Review of emergency department note from 8/13 shows she was counseled supportive care Independent review of the right ankle x-ray from 8/13 shows no acute changes.  Review of Systems See HPI   HISTORY: Past Medical, Surgical, Social, and Family History Reviewed & Updated per EMR.   Pertinent Historical Findings include:  History reviewed. No pertinent past medical history.  Past Surgical History:  Procedure Laterality Date   OTHER SURGICAL HISTORY      birthmark removed from forehead age 8     PHYSICAL EXAM:  VS: BP 110/76 (BP Location: Left Arm, Patient Position: Sitting)   Ht 5\' 4"  (1.626 m)   Wt 125 lb (56.7 kg)   LMP 05/07/2022 (Approximate)   BMI 21.46 kg/m  Physical Exam Gen: NAD, alert, cooperative with exam, well-appearing MSK:  Neurovascularly intact    Limited ultrasound: Right ankle:  Large effusion appreciated within the ankle joint. No changes of the lateral malleolus. Normal-appearing peroneal tendons. Normal-appearing base of the fifth metatarsal. Hyperemia over the lateral talar dome with cortical irregularity to suggest a nondisplaced fracture.  Summary: Findings most consistent with a nondisplaced lateral talar dome fracture  Ultrasound and interpretation by 05/09/2022, MD    ASSESSMENT & PLAN:   Closed nondisplaced dome fracture of right talus Acutely occurring.  Initial injury on 8/13.  Appears that the lateral talar dome has a nondisplaced fracture as there is an effusion within the ankle joint. -Counseled  on home exercise therapy and supportive care. -Cam walker. -Counseled on crutches. -Could consider further imaging.

## 2022-06-01 NOTE — Patient Instructions (Signed)
Good to see you Please use ice as needed  Please use the boot and the crutches   Please send me a message in MyChart with any questions or updates.  Please see me back in 2-3 weeks.   --Dr. Jordan Likes

## 2022-06-01 NOTE — Assessment & Plan Note (Signed)
Acutely occurring.  Initial injury on 8/13.  Appears that the lateral talar dome has a nondisplaced fracture as there is an effusion within the ankle joint. -Counseled on home exercise therapy and supportive care. -Cam walker. -Counseled on crutches. -Could consider further imaging.

## 2022-06-15 ENCOUNTER — Ambulatory Visit (INDEPENDENT_AMBULATORY_CARE_PROVIDER_SITE_OTHER): Payer: Medicaid Other | Admitting: Family Medicine

## 2022-06-15 ENCOUNTER — Encounter: Payer: Self-pay | Admitting: Family Medicine

## 2022-06-15 VITALS — BP 110/70 | Ht 64.0 in | Wt 125.0 lb

## 2022-06-15 DIAGNOSIS — S93491D Sprain of other ligament of right ankle, subsequent encounter: Secondary | ICD-10-CM | POA: Diagnosis not present

## 2022-06-15 NOTE — Patient Instructions (Signed)
Good to see you Please try the insoles  Please use ice as needed  Please try the exercises  You can stop the lace up brace when you feel no pain and feel stable  Please send me a message in MyChart with any questions or updates.  Please see me back as needed.   --Dr. Jordan Likes

## 2022-06-15 NOTE — Progress Notes (Signed)
  Heather Maddox - 41 y.o. female MRN [id number]  Date of birth: 12-12-1980  SUBJECTIVE:  Including CC & ROS.  No chief complaint on file.   Heather Maddox is a 41 y.o. female that is following up for her right ankle pain.  She is feeling well and has been using the lace up ankle brace instead of the cam walker.  She denies any pain unless she is trying to run or put more pressure on the ankle joint.   Review of Systems See HPI   HISTORY: Past Medical, Surgical, Social, and Family History Reviewed & Updated per EMR.   Pertinent Historical Findings include:  History reviewed. No pertinent past medical history.  Past Surgical History:  Procedure Laterality Date   OTHER SURGICAL HISTORY      birthmark removed from forehead age 41     PHYSICAL EXAM:  VS: BP 110/70 (BP Location: Left Arm, Patient Position: Sitting)   Ht 5\' 4"  (1.626 m)   Wt 125 lb (56.7 kg)   LMP 05/07/2022 (Approximate)   BMI 21.46 kg/m  Physical Exam Gen: NAD, alert, cooperative with exam, well-appearing MSK:  Neurovascularly intact       ASSESSMENT & PLAN:   Sprain of ankle Initial injury on 8/13.  She is feeling much better today.  Symptoms more consistent with a contusion versus a sprain as opposed to a fracture based on exam.  She is able to weight-bear without significant pain.  Minor tenderness to palpation. -Counseled on home exercise therapy and supportive care. -Counseled on lace up brace. -Could consider physical therapy or further imaging.

## 2022-06-15 NOTE — Assessment & Plan Note (Signed)
Initial injury on 8/13.  She is feeling much better today.  Symptoms more consistent with a contusion versus a sprain as opposed to a fracture based on exam.  She is able to weight-bear without significant pain.  Minor tenderness to palpation. -Counseled on home exercise therapy and supportive care. -Counseled on lace up brace. -Could consider physical therapy or further imaging.

## 2022-06-16 DIAGNOSIS — Z419 Encounter for procedure for purposes other than remedying health state, unspecified: Secondary | ICD-10-CM | POA: Diagnosis not present

## 2022-06-21 ENCOUNTER — Encounter: Payer: Self-pay | Admitting: Family Medicine

## 2022-06-21 DIAGNOSIS — K219 Gastro-esophageal reflux disease without esophagitis: Secondary | ICD-10-CM | POA: Diagnosis not present

## 2022-06-21 DIAGNOSIS — Z681 Body mass index (BMI) 19 or less, adult: Secondary | ICD-10-CM | POA: Diagnosis not present

## 2022-06-21 DIAGNOSIS — F17210 Nicotine dependence, cigarettes, uncomplicated: Secondary | ICD-10-CM | POA: Diagnosis not present

## 2022-06-23 DIAGNOSIS — Z6820 Body mass index (BMI) 20.0-20.9, adult: Secondary | ICD-10-CM | POA: Diagnosis not present

## 2022-06-23 DIAGNOSIS — Z7251 High risk heterosexual behavior: Secondary | ICD-10-CM | POA: Diagnosis not present

## 2022-06-23 DIAGNOSIS — R399 Unspecified symptoms and signs involving the genitourinary system: Secondary | ICD-10-CM | POA: Diagnosis not present

## 2022-06-23 DIAGNOSIS — N760 Acute vaginitis: Secondary | ICD-10-CM | POA: Diagnosis not present

## 2022-06-23 DIAGNOSIS — B3731 Acute candidiasis of vulva and vagina: Secondary | ICD-10-CM | POA: Diagnosis not present

## 2022-06-23 DIAGNOSIS — B9689 Other specified bacterial agents as the cause of diseases classified elsewhere: Secondary | ICD-10-CM | POA: Diagnosis not present

## 2022-07-16 DIAGNOSIS — Z419 Encounter for procedure for purposes other than remedying health state, unspecified: Secondary | ICD-10-CM | POA: Diagnosis not present

## 2022-08-16 DIAGNOSIS — Z419 Encounter for procedure for purposes other than remedying health state, unspecified: Secondary | ICD-10-CM | POA: Diagnosis not present

## 2022-08-25 ENCOUNTER — Other Ambulatory Visit: Payer: Self-pay

## 2022-08-25 ENCOUNTER — Emergency Department (HOSPITAL_BASED_OUTPATIENT_CLINIC_OR_DEPARTMENT_OTHER)
Admission: EM | Admit: 2022-08-25 | Discharge: 2022-08-25 | Disposition: A | Discharge status: Home / Self Care | Payer: Medicaid Other | Attending: Emergency Medicine | Admitting: Emergency Medicine

## 2022-08-25 ENCOUNTER — Encounter (HOSPITAL_BASED_OUTPATIENT_CLINIC_OR_DEPARTMENT_OTHER): Payer: Self-pay | Admitting: Emergency Medicine

## 2022-08-25 ENCOUNTER — Emergency Department (HOSPITAL_BASED_OUTPATIENT_CLINIC_OR_DEPARTMENT_OTHER): Payer: Medicaid Other

## 2022-08-25 DIAGNOSIS — M79644 Pain in right finger(s): Secondary | ICD-10-CM | POA: Insufficient documentation

## 2022-08-25 DIAGNOSIS — S62664A Nondisplaced fracture of distal phalanx of right ring finger, initial encounter for closed fracture: Secondary | ICD-10-CM | POA: Diagnosis not present

## 2022-08-25 DIAGNOSIS — S6991XA Unspecified injury of right wrist, hand and finger(s), initial encounter: Secondary | ICD-10-CM | POA: Diagnosis present

## 2022-08-25 DIAGNOSIS — M7989 Other specified soft tissue disorders: Secondary | ICD-10-CM | POA: Diagnosis not present
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
## 2022-08-25 NOTE — ED Triage Notes (Signed)
Right hand injury 2 weeks ago , right 4th finger swelling and pain radiating to palm.

## 2022-08-25 NOTE — Discharge Instructions (Signed)
Wear the splint and follow-up with a hand doctor.  Return to the ED with worsening pain, weakness, numbness or tingling or other concerns.

## 2022-08-30 DIAGNOSIS — M20011 Mallet finger of right finger(s): Secondary | ICD-10-CM | POA: Diagnosis not present

## 2022-08-30 DIAGNOSIS — M79644 Pain in right finger(s): Secondary | ICD-10-CM | POA: Diagnosis not present

## 2022-09-15 DIAGNOSIS — Z419 Encounter for procedure for purposes other than remedying health state, unspecified: Secondary | ICD-10-CM | POA: Diagnosis not present

## 2022-10-16 DIAGNOSIS — Z419 Encounter for procedure for purposes other than remedying health state, unspecified: Secondary | ICD-10-CM | POA: Diagnosis not present

## 2022-10-27 DIAGNOSIS — M79644 Pain in right finger(s): Secondary | ICD-10-CM | POA: Diagnosis not present

## 2022-11-16 DIAGNOSIS — Z419 Encounter for procedure for purposes other than remedying health state, unspecified: Secondary | ICD-10-CM | POA: Diagnosis not present

## 2022-12-15 DIAGNOSIS — Z419 Encounter for procedure for purposes other than remedying health state, unspecified: Secondary | ICD-10-CM | POA: Diagnosis not present

## 2023-01-15 DIAGNOSIS — Z419 Encounter for procedure for purposes other than remedying health state, unspecified: Secondary | ICD-10-CM | POA: Diagnosis not present

## 2023-01-29 ENCOUNTER — Encounter: Payer: Self-pay | Admitting: *Deleted

## 2023-02-14 DIAGNOSIS — Z419 Encounter for procedure for purposes other than remedying health state, unspecified: Secondary | ICD-10-CM | POA: Diagnosis not present

## 2023-03-17 DIAGNOSIS — Z419 Encounter for procedure for purposes other than remedying health state, unspecified: Secondary | ICD-10-CM | POA: Diagnosis not present

## 2023-03-24 ENCOUNTER — Other Ambulatory Visit: Payer: Self-pay

## 2023-03-24 ENCOUNTER — Encounter (HOSPITAL_BASED_OUTPATIENT_CLINIC_OR_DEPARTMENT_OTHER): Payer: Self-pay

## 2023-03-24 ENCOUNTER — Emergency Department (HOSPITAL_BASED_OUTPATIENT_CLINIC_OR_DEPARTMENT_OTHER): Payer: Medicaid Other

## 2023-03-24 ENCOUNTER — Emergency Department (HOSPITAL_BASED_OUTPATIENT_CLINIC_OR_DEPARTMENT_OTHER)
Admission: EM | Admit: 2023-03-24 | Discharge: 2023-03-24 | Disposition: A | Discharge status: Home / Self Care | Payer: Medicaid Other | Attending: Emergency Medicine | Admitting: Emergency Medicine

## 2023-03-24 DIAGNOSIS — S6992XA Unspecified injury of left wrist, hand and finger(s), initial encounter: Secondary | ICD-10-CM

## 2023-03-24 DIAGNOSIS — R030 Elevated blood-pressure reading, without diagnosis of hypertension: Secondary | ICD-10-CM | POA: Insufficient documentation

## 2023-03-24 DIAGNOSIS — S60032A Contusion of left middle finger without damage to nail, initial encounter: Secondary | ICD-10-CM | POA: Diagnosis not present

## 2023-03-24 DIAGNOSIS — S60132A Contusion of left middle finger with damage to nail, initial encounter: Secondary | ICD-10-CM | POA: Insufficient documentation

## 2023-03-24 DIAGNOSIS — W228XXA Striking against or struck by other objects, initial encounter: Secondary | ICD-10-CM | POA: Diagnosis not present

## 2023-03-24 DIAGNOSIS — I10 Essential (primary) hypertension: Secondary | ICD-10-CM | POA: Diagnosis not present

## 2023-03-24 MED ORDER — ACETAMINOPHEN 325 MG PO TABS
650.0000 mg | ORAL_TABLET | Freq: Once | ORAL | Status: AC
  Administered 2023-03-24: 650 mg via ORAL
  Filled 2023-03-24: qty 2

## 2023-03-24 NOTE — ED Triage Notes (Signed)
States had artificial nail put on yesterday, hit middle finger on the fence and it broke the artificial nail. Slight bleeding noted.

## 2023-03-24 NOTE — Discharge Instructions (Signed)
It was our pleasure to provide your ER care today - we hope that you feel better.  Your xray looks good, no fracture of the finger tip.  You may trim nail to keep area from snagging on clothing or other material and ripping.   Take acetaminophen or ibuprofen as need.   Your blood pressure is high today (possible in part, due to your pain) - follow up with primary care doctor in the next couple weeks for recheck.  Return to ER if worse, new symptoms, severe pain, infection of area, or other concern.

## 2023-03-24 NOTE — ED Provider Notes (Addendum)
[place] [facility] Provider Note   CSN: [id number] Arrival date & time: 03/24/23  1444     History  Chief Complaint  Patient presents with   Hand Pain    Heather Maddox is a 42 y.o. female.  Pt c/o pain to distal tip of left middle finger. States gate post game down on/contused distal phalanx and nail area. Crack to artifical nail just distal to the end of finger tip. Nail firmly in place. No severe sts noted. Skin appears intact.   The history is provided by the patient.  Hand Pain       Home Medications Prior to Admission medications   Medication Sig Start Date End Date Taking? Authorizing Provider  ondansetron (ZOFRAN ODT) 4 MG disintegrating tablet Take 1 tablet (4 mg total) by mouth every 8 (eight) hours as needed for nausea or vomiting. 10/26/20   Pollyann Savoy, MD      Allergies    Patient has no known allergies.    Review of Systems   Review of Systems  Constitutional:  Negative for fever.  Musculoskeletal:        Contusion finger tip.   Skin:  Negative for wound.  Neurological:  Negative for numbness.    Physical Exam Updated Vital Signs BP (!) 152/86 (BP Location: Right Arm)   Pulse 73   Temp 98.5 F (36.9 C) (Oral)   Resp 18   Ht 1.626 m (5\' 4" )   SpO2 100%   BMI 21.46 kg/m  Physical Exam Vitals and nursing note reviewed.  Constitutional:      Appearance: Normal appearance. She is well-developed.  HENT:     Head: Atraumatic.     Nose: Nose normal.  Neck:     Trachea: No tracheal deviation.  Cardiovascular:     Rate and Rhythm: Normal rate.     Pulses: Normal pulses.  Pulmonary:     Effort: Pulmonary effort is normal. No respiratory distress.  Musculoskeletal:        General: No swelling.     Cervical back: Neck supple. No muscular tenderness.     Comments: Tenderness distal phalanx left middle finger. Skin is intact. Nail is firmly intact. Crack to nail/artificial nail just distal to tip of  finger. No significant sts noted. Normal cap refill distal tip. Normal movement of finger/distal phalanx.   Skin:    General: Skin is warm and dry.     Findings: No rash.  Neurological:     Mental Status: She is alert.     Comments: Alert, speech normal.   Psychiatric:        Mood and Affect: Mood normal.     ED Results / Procedures / Treatments   Labs (all labs ordered are listed, but only abnormal results are displayed) Labs Reviewed - No data to display  EKG None  Radiology No fracture  Procedures Procedures    Medications Ordered in ED Medications  acetaminophen (TYLENOL) tablet 650 mg (650 mg Oral Given 03/24/23 1629)    ED Course/ Medical Decision Making/ A&P                             Medical Decision Making Problems Addressed: Contusion of left middle finger with damage to nail, initial encounter: acute illness or injury Elevated blood pressure reading: acute illness or injury Fingernail injury, left, initial encounter: acute illness or injury  Amount and/or Complexity of  Data Reviewed External Data Reviewed: notes. Radiology: ordered and independent interpretation performed. Decision-making details documented in ED Course.  Risk OTC drugs.   Imaging ordered.  Reviewed nursing notes and prior charts for additional history.   Acetaminophen po.  Staff to offer to trip nail at crack in nail, apply finger splint/dressing for comfort/protection of tip of finger.   Xrays reviewed/interpreted by me - no fx.   Pt appears stable for d/c.         Final Clinical Impression(s) / ED Diagnoses Final diagnoses:  Contusion of left middle finger with damage to nail, initial encounter  Fingernail injury, left, initial encounter  Elevated blood pressure reading    Rx / DC Orders ED Discharge Orders     None         Cathren Laine, MD 03/24/23 1624    Cathren Laine, MD 03/24/23 1655

## 2023-04-16 DIAGNOSIS — Z419 Encounter for procedure for purposes other than remedying health state, unspecified: Secondary | ICD-10-CM | POA: Diagnosis not present

## 2023-05-17 DIAGNOSIS — Z419 Encounter for procedure for purposes other than remedying health state, unspecified: Secondary | ICD-10-CM | POA: Diagnosis not present

## 2023-06-17 DIAGNOSIS — Z419 Encounter for procedure for purposes other than remedying health state, unspecified: Secondary | ICD-10-CM | POA: Diagnosis not present

## 2023-07-17 DIAGNOSIS — Z419 Encounter for procedure for purposes other than remedying health state, unspecified: Secondary | ICD-10-CM | POA: Diagnosis not present

## 2023-07-21 ENCOUNTER — Emergency Department (HOSPITAL_BASED_OUTPATIENT_CLINIC_OR_DEPARTMENT_OTHER)
Admission: EM | Admit: 2023-07-21 | Discharge: 2023-07-21 | Disposition: A | Discharge status: Home / Self Care | Payer: Medicaid Other | Attending: Emergency Medicine | Admitting: Emergency Medicine

## 2023-07-21 ENCOUNTER — Other Ambulatory Visit: Payer: Self-pay

## 2023-07-21 ENCOUNTER — Encounter (HOSPITAL_BASED_OUTPATIENT_CLINIC_OR_DEPARTMENT_OTHER): Payer: Self-pay

## 2023-07-21 DIAGNOSIS — R059 Cough, unspecified: Secondary | ICD-10-CM | POA: Diagnosis present

## 2023-07-21 DIAGNOSIS — B9789 Other viral agents as the cause of diseases classified elsewhere: Secondary | ICD-10-CM | POA: Diagnosis not present

## 2023-07-21 DIAGNOSIS — Z3202 Encounter for pregnancy test, result negative: Secondary | ICD-10-CM | POA: Diagnosis not present

## 2023-07-21 DIAGNOSIS — J069 Acute upper respiratory infection, unspecified: Secondary | ICD-10-CM | POA: Insufficient documentation

## 2023-07-21 DIAGNOSIS — Z1152 Encounter for screening for COVID-19: Secondary | ICD-10-CM | POA: Diagnosis not present

## 2023-07-21 LAB — SARS CORONAVIRUS 2 BY RT PCR: SARS Coronavirus 2 by RT PCR: NEGATIVE

## 2023-07-21 LAB — PREGNANCY, URINE: Preg Test, Ur: NEGATIVE

## 2023-07-21 MED ORDER — ALBUTEROL SULFATE HFA 108 (90 BASE) MCG/ACT IN AERS: 1.0000 | INHALATION_SPRAY | Freq: Four times a day (QID) | RESPIRATORY_TRACT | 0 refills | Status: AC | PRN

## 2023-07-21 NOTE — ED Triage Notes (Signed)
The patient has had cough and body aches for three days. She is concerned about pregnancy.

## 2023-07-21 NOTE — ED Provider Notes (Signed)
[facility] Provider Note   CSN: [id number] Arrival date & time: 07/21/23  7829     History  Chief Complaint  Patient presents with   Cough    Heather Maddox is a 42 y.o. female.  Patient with noncontributory past medical history presents today with complaints of cough and congestion. She states that same has been ongoing for the past 3 days.  She denies any known sick contacts.  She has not taken any medications for symptoms as she states she does not take any cold medication because it makes her feel nauseous. She states her cough is worse at night.  Denies any chest pain, shortness of breath, nausea, vomiting, or abdominal pain.  States that previously she is coming with similar symptoms and received an albuterol inhaler which helps her symptoms.  She has lost her last one and is requesting a refill.  She does smoke daily.  She is also requesting a pregnancy test, denies any abdominal pain or vaginal discharge.  The history is provided by the patient. No language interpreter was used.  Cough      Home Medications Prior to Admission medications   Medication Sig Start Date End Date Taking? Authorizing Provider  ondansetron (ZOFRAN ODT) 4 MG disintegrating tablet Take 1 tablet (4 mg total) by mouth every 8 (eight) hours as needed for nausea or vomiting. 10/26/20   Pollyann Savoy, MD      Allergies    Patient has no known allergies.    Review of Systems   Review of Systems  HENT:  Positive for congestion.   Respiratory:  Positive for cough.   All other systems reviewed and are negative.   Physical Exam Updated Vital Signs BP 127/77   Pulse 72   Temp 99.2 F (37.3 C) (Oral)   Resp 18   Ht 5\' 4"  (1.626 m)   Wt 56 kg   LMP 05/21/2023 (Exact Date)   SpO2 100%   BMI 21.19 kg/m  Physical Exam Vitals and nursing note reviewed.  Constitutional:      General: She is not in acute distress.    Appearance: Normal appearance.  She is normal weight. She is not ill-appearing, toxic-appearing or diaphoretic.  HENT:     Head: Normocephalic and atraumatic.  Cardiovascular:     Rate and Rhythm: Normal rate and regular rhythm.     Heart sounds: Normal heart sounds.  Pulmonary:     Effort: Pulmonary effort is normal. No respiratory distress.     Breath sounds: Normal breath sounds.  Abdominal:     General: Abdomen is flat.     Palpations: Abdomen is soft.     Tenderness: There is no abdominal tenderness.  Musculoskeletal:        General: Normal range of motion.     Cervical back: Normal range of motion.  Skin:    General: Skin is warm and dry.  Neurological:     General: No focal deficit present.     Mental Status: She is alert.  Psychiatric:        Mood and Affect: Mood normal.        Behavior: Behavior normal.     ED Results / Procedures / Treatments   Labs (all labs ordered are listed, but only abnormal results are displayed) Labs Reviewed  SARS CORONAVIRUS 2 BY RT PCR  PREGNANCY, URINE    EKG None  Radiology No results found.  Procedures Procedures  Medications Ordered in ED Medications - No data to display  ED Course/ Medical Decision Making/ A&P                                 Medical Decision Making Amount and/or Complexity of Data Reviewed Labs: ordered.   Patient presents today with complaints of cough and congestion x 3 days.  She is afebrile, nontoxic-appearing, no acute distress reassuring vital signs.  Physical exam reveals lung sounds clear to auscultation in all fields without any wheezing.  Given this with short duration of symptoms, no indication for further evaluation with chest x-ray.  Discussed this with patient is understanding agreement, notes that she just wanted a prescription for an inhaler to help with her symptoms.  Patient's COVID swab is negative.  She also requested a pregnancy test with that is negative.  I reviewed these labs.  Suspect symptoms are likely  related to URI, likely viral etiology.  Discussed with patient that no antibiotics are indicated in that circumstance.  Symptoms likely worsening due to patient's daily smoking, discussed smoking cessation with the patient.  Prescription sent for inhaler, offered cough medication which she declined. Evaluation and diagnostic testing in the emergency department does not suggest an emergent condition requiring admission or immediate intervention beyond what has been performed at this time.  Plan for discharge with close PCP follow-up.  Patient is understanding and amenable with plan, educated on red flag symptoms that would prompt immediate return.  Patient discharged in stable condition.  Final Clinical Impression(s) / ED Diagnoses Final diagnoses:  Viral URI with cough  Negative pregnancy test    Rx / DC Orders ED Discharge Orders          Ordered    albuterol (VENTOLIN HFA) 108 (90 Base) MCG/ACT inhaler  Every 6 hours PRN        07/21/23 1147          An After Visit Summary was printed and given to the patient.     Vear Clock 07/21/23 1147    Gwyneth Sprout, MD 07/21/23 1501

## 2023-07-21 NOTE — Discharge Instructions (Signed)
Your work-up in the ER today was reassuring for acute findings. You were swabbed for COVID which was negative. However, your symptoms are still likely related to an upper respiratory infection. As these are almost always viral in nature, no antibiotics are indicated. I recommend that you get plenty of rest and focus on symptomatic relief which includes Cepacol throat lozenges for sore throat, Mucinex D (orange box) which you can get from behind the counter at your local pharmacy for congestion, and tylenol/ibuprofen as needed for fevers and bodyaches. I have also given you a prescription for an albuterol inhaler for you to take as prescribed as needed for management of your symptoms. I also recommend:  Increased fluid intake. Sports drinks offer valuable electrolytes, sugars, and fluids.  Breathing heated mist or steam (vaporizer or shower).  Eating chicken soup or other clear broths, and maintaining good nutrition.   Increasing usage of your inhaler if you have asthma.  Return to work when your temperature has returned to normal.  Gargle warm salt water and spit it out for sore throat. Take benadryl or Zyrtec to decrease sinus secretions.   Follow Up: Follow up with your primary care doctor in 5-7 days for recheck of ongoing symptoms.  Return to emergency department for emergent changing or worsening of symptoms.

## 2023-08-17 DIAGNOSIS — Z419 Encounter for procedure for purposes other than remedying health state, unspecified: Secondary | ICD-10-CM | POA: Diagnosis not present

## 2023-09-16 DIAGNOSIS — Z419 Encounter for procedure for purposes other than remedying health state, unspecified: Secondary | ICD-10-CM | POA: Diagnosis not present

## 2023-09-17 DIAGNOSIS — Z3200 Encounter for pregnancy test, result unknown: Secondary | ICD-10-CM | POA: Diagnosis not present

## 2023-09-17 DIAGNOSIS — Z6821 Body mass index (BMI) 21.0-21.9, adult: Secondary | ICD-10-CM | POA: Diagnosis not present

## 2023-09-17 DIAGNOSIS — F17210 Nicotine dependence, cigarettes, uncomplicated: Secondary | ICD-10-CM | POA: Diagnosis not present

## 2023-09-17 DIAGNOSIS — Z1159 Encounter for screening for other viral diseases: Secondary | ICD-10-CM | POA: Diagnosis not present

## 2023-09-17 DIAGNOSIS — D539 Nutritional anemia, unspecified: Secondary | ICD-10-CM | POA: Diagnosis not present

## 2023-09-17 DIAGNOSIS — Z0000 Encounter for general adult medical examination without abnormal findings: Secondary | ICD-10-CM | POA: Diagnosis not present

## 2023-09-17 DIAGNOSIS — Z7251 High risk heterosexual behavior: Secondary | ICD-10-CM | POA: Diagnosis not present

## 2023-10-12 DIAGNOSIS — B009 Herpesviral infection, unspecified: Secondary | ICD-10-CM | POA: Diagnosis not present

## 2023-10-12 DIAGNOSIS — F17210 Nicotine dependence, cigarettes, uncomplicated: Secondary | ICD-10-CM | POA: Diagnosis not present

## 2023-10-12 DIAGNOSIS — Z6822 Body mass index (BMI) 22.0-22.9, adult: Secondary | ICD-10-CM | POA: Diagnosis not present

## 2023-10-17 DIAGNOSIS — Z419 Encounter for procedure for purposes other than remedying health state, unspecified: Secondary | ICD-10-CM | POA: Diagnosis not present

## 2023-11-17 DIAGNOSIS — Z419 Encounter for procedure for purposes other than remedying health state, unspecified: Secondary | ICD-10-CM | POA: Diagnosis not present

## 2023-12-15 DIAGNOSIS — Z419 Encounter for procedure for purposes other than remedying health state, unspecified: Secondary | ICD-10-CM | POA: Diagnosis not present

## 2024-01-26 DIAGNOSIS — Z419 Encounter for procedure for purposes other than remedying health state, unspecified: Secondary | ICD-10-CM | POA: Diagnosis not present

## 2024-01-27 DIAGNOSIS — R399 Unspecified symptoms and signs involving the genitourinary system: Secondary | ICD-10-CM | POA: Diagnosis not present

## 2024-01-27 DIAGNOSIS — Z7251 High risk heterosexual behavior: Secondary | ICD-10-CM | POA: Diagnosis not present

## 2024-01-27 DIAGNOSIS — R300 Dysuria: Secondary | ICD-10-CM | POA: Diagnosis not present

## 2024-01-27 DIAGNOSIS — Z6822 Body mass index (BMI) 22.0-22.9, adult: Secondary | ICD-10-CM | POA: Diagnosis not present

## 2024-02-25 DIAGNOSIS — Z419 Encounter for procedure for purposes other than remedying health state, unspecified: Secondary | ICD-10-CM | POA: Diagnosis not present

## 2024-03-10 DIAGNOSIS — K219 Gastro-esophageal reflux disease without esophagitis: Secondary | ICD-10-CM | POA: Diagnosis not present

## 2024-03-10 DIAGNOSIS — R1110 Vomiting, unspecified: Secondary | ICD-10-CM | POA: Diagnosis not present

## 2024-03-10 DIAGNOSIS — R1084 Generalized abdominal pain: Secondary | ICD-10-CM | POA: Diagnosis not present

## 2024-03-10 DIAGNOSIS — Z3200 Encounter for pregnancy test, result unknown: Secondary | ICD-10-CM | POA: Diagnosis not present

## 2024-03-10 DIAGNOSIS — Z6822 Body mass index (BMI) 22.0-22.9, adult: Secondary | ICD-10-CM | POA: Diagnosis not present

## 2024-03-12 DIAGNOSIS — R1084 Generalized abdominal pain: Secondary | ICD-10-CM | POA: Diagnosis not present

## 2024-03-27 DIAGNOSIS — Z419 Encounter for procedure for purposes other than remedying health state, unspecified: Secondary | ICD-10-CM | POA: Diagnosis not present

## 2024-04-07 DIAGNOSIS — R1084 Generalized abdominal pain: Secondary | ICD-10-CM | POA: Diagnosis not present

## 2024-04-07 DIAGNOSIS — K458 Other specified abdominal hernia without obstruction or gangrene: Secondary | ICD-10-CM | POA: Diagnosis not present

## 2024-04-07 DIAGNOSIS — Z6822 Body mass index (BMI) 22.0-22.9, adult: Secondary | ICD-10-CM | POA: Diagnosis not present

## 2024-04-07 DIAGNOSIS — R030 Elevated blood-pressure reading, without diagnosis of hypertension: Secondary | ICD-10-CM | POA: Diagnosis not present

## 2024-04-07 DIAGNOSIS — R748 Abnormal levels of other serum enzymes: Secondary | ICD-10-CM | POA: Diagnosis not present

## 2024-04-07 DIAGNOSIS — K219 Gastro-esophageal reflux disease without esophagitis: Secondary | ICD-10-CM | POA: Diagnosis not present

## 2024-04-15 DIAGNOSIS — R1084 Generalized abdominal pain: Secondary | ICD-10-CM | POA: Diagnosis not present

## 2024-04-26 DIAGNOSIS — Z419 Encounter for procedure for purposes other than remedying health state, unspecified: Secondary | ICD-10-CM | POA: Diagnosis not present

## 2024-05-25 ENCOUNTER — Emergency Department (HOSPITAL_BASED_OUTPATIENT_CLINIC_OR_DEPARTMENT_OTHER)
Admission: EM | Admit: 2024-05-25 | Discharge: 2024-05-25 | Disposition: A | Discharge status: Home / Self Care | Attending: Emergency Medicine | Admitting: Emergency Medicine

## 2024-05-25 ENCOUNTER — Other Ambulatory Visit: Payer: Self-pay

## 2024-05-25 ENCOUNTER — Encounter (HOSPITAL_BASED_OUTPATIENT_CLINIC_OR_DEPARTMENT_OTHER): Payer: Self-pay

## 2024-05-25 ENCOUNTER — Ambulatory Visit (HOSPITAL_BASED_OUTPATIENT_CLINIC_OR_DEPARTMENT_OTHER)

## 2024-05-25 DIAGNOSIS — Z5329 Procedure and treatment not carried out because of patient's decision for other reasons: Secondary | ICD-10-CM | POA: Diagnosis not present

## 2024-05-25 DIAGNOSIS — Z5321 Procedure and treatment not carried out due to patient leaving prior to being seen by health care provider: Secondary | ICD-10-CM

## 2024-05-25 DIAGNOSIS — N760 Acute vaginitis: Secondary | ICD-10-CM | POA: Diagnosis not present

## 2024-05-25 DIAGNOSIS — B9689 Other specified bacterial agents as the cause of diseases classified elsewhere: Secondary | ICD-10-CM

## 2024-05-25 DIAGNOSIS — N939 Abnormal uterine and vaginal bleeding, unspecified: Secondary | ICD-10-CM | POA: Diagnosis not present

## 2024-05-25 DIAGNOSIS — A599 Trichomoniasis, unspecified: Secondary | ICD-10-CM

## 2024-05-25 DIAGNOSIS — R319 Hematuria, unspecified: Secondary | ICD-10-CM | POA: Diagnosis present

## 2024-05-25 DIAGNOSIS — N3001 Acute cystitis with hematuria: Secondary | ICD-10-CM | POA: Insufficient documentation

## 2024-05-25 DIAGNOSIS — A5901 Trichomonal vulvovaginitis: Secondary | ICD-10-CM | POA: Diagnosis not present

## 2024-05-25 LAB — URINALYSIS, W/ REFLEX TO CULTURE (INFECTION SUSPECTED)
Glucose, UA: NEGATIVE mg/dL
Ketones, ur: NEGATIVE mg/dL
Leukocytes,Ua: NEGATIVE
Nitrite: POSITIVE — AB
Protein, ur: >=300 mg/dL — AB
RBC / HPF: >50 RBC/hpf (ref 0–5)
Specific Gravity, Urine: >=1.03 (ref 1.005–1.030)
pH: 5.5 (ref 5.0–8.0)

## 2024-05-25 LAB — WET PREP, GENITAL
Sperm: NONE SEEN
WBC, Wet Prep HPF POC: <10 (ref <10)
Yeast Wet Prep HPF POC: NONE SEEN

## 2024-05-25 LAB — PREGNANCY, URINE: Preg Test, Ur: NEGATIVE

## 2024-05-25 MED ORDER — KETOROLAC TROMETHAMINE 30 MG/ML IJ SOLN
30.0000 mg | Freq: Once | INTRAMUSCULAR | Status: AC
Start: 2024-05-25 — End: 2024-05-25
  Administered 2024-05-25: 30 mg via INTRAMUSCULAR
  Filled 2024-05-25: qty 1

## 2024-05-25 NOTE — ED Provider Notes (Signed)
 [place] [facility] Provider Note   CSN: [id number] Arrival date & time: 05/25/24  1238    Patient presents with: Vaginal Bleeding  Heather Maddox is a 43 y.o. female here for vaginal bleeding? Hematuria? Woke up this morning blood in toilet with urination. Lower abd cramping. Typical very regular cycles except for last month, had 2 menstrual cycles.  Intermittent nausea for a while. No emesis. No triggers, not associated with meals. No diarrhea or constipation. No syncope. No urgency, frequency. No flank pain, vag dc or concern for STI.   HPI     Prior to Admission medications   Medication Sig Start Date End Date Taking? Authorizing Provider  albuterol  (VENTOLIN  HFA) 108 (90 Base) MCG/ACT inhaler Inhale 1-2 puffs into the lungs every 6 (six) hours as needed for wheezing or shortness of breath. 07/21/23   Smoot, Lauraine LABOR, PA-C  ondansetron  (ZOFRAN  ODT) 4 MG disintegrating tablet Take 1 tablet (4 mg total) by mouth every 8 (eight) hours as needed for nausea or vomiting. 10/26/20   Roselyn Carlin NOVAK, MD    Allergies: Patient has no known allergies.    Review of Systems  Constitutional: Negative.   HENT: Negative.    Respiratory: Negative.    Cardiovascular: Negative.   Gastrointestinal:  Positive for nausea. Negative for vomiting.  Genitourinary:  Positive for hematuria. Negative for flank pain.  Musculoskeletal: Negative.   Skin: Negative.   Neurological: Negative.   All other systems reviewed and are negative.   Updated Vital Signs BP 126/63 (BP Location: Left Arm)   Pulse 64   Temp 98.6 F (37 C)   Resp 20   Wt 59 kg   LMP 04/29/2024 (Exact Date)   SpO2 100%   BMI 22.31 kg/m   Physical Exam Vitals and nursing note reviewed.  Constitutional:      General: She is not in acute distress.    Appearance: She is well-developed. She is not ill-appearing, toxic-appearing or diaphoretic.  HENT:     Head: Normocephalic and atraumatic.      Nose: Nose normal.  Eyes:     Pupils: Pupils are equal, round, and reactive to light.  Cardiovascular:     Rate and Rhythm: Normal rate.  Pulmonary:     Effort: No respiratory distress.  Abdominal:     General: Bowel sounds are normal. There is no distension.     Tenderness: There is abdominal tenderness. There is no right CVA tenderness, left CVA tenderness or guarding.   Musculoskeletal:        General: Normal range of motion.     Cervical back: Normal range of motion.  Skin:    General: Skin is warm and dry.  Neurological:     General: No focal deficit present.     Mental Status: She is alert.  Psychiatric:        Mood and Affect: Mood normal.     (all labs ordered are listed, but only abnormal results are displayed) Labs Reviewed  WET PREP, GENITAL - Abnormal; Notable for the following components:      Result Value   Trich, Wet Prep PRESENT (*)    Clue Cells Wet Prep HPF POC PRESENT (*)    All other components within normal limits  URINALYSIS, W/ REFLEX TO CULTURE (INFECTION SUSPECTED) - Abnormal; Notable for the following components:   Color, Urine BROWN (*)    APPearance CLOUDY (*)    Hgb urine dipstick LARGE (*)  Bilirubin Urine SMALL (*)    Protein, ur >=300 (*)    Nitrite POSITIVE (*)    Bacteria, UA FEW (*)    All other components within normal limits  URINE CULTURE  PREGNANCY, URINE  GC/CHLAMYDIA PROBE AMP ([facility]) NOT AT [facility]    EKG: None  Radiology: No results found.   Procedures   Medications Ordered in the ED  ketorolac  (TORADOL ) 30 MG/ML injection 30 mg (30 mg Intramuscular Given 05/25/24 1687)   43 year old here for evaluation of lower abdominal pain and noted blood in the toilet with urinating.  She is unsure if this is vaginal bleeding or hematuria.  No flank pain.  No systemic symptoms.  No concern for STI, discharge.  Will plan on labs, imaging and reassess  Labs imaging personally viewed and interpreted:  UA nitrate  positive Wet prep positive for Trich, BV Preg neg  Went to reassess patient. Not in room. Per nursing patient eloped from the ED. Did not get to discuss labs--she eloped prior to US  being performed.  Attempted to call patient-no answer x2. Unable to leave VM.                                     Medical Decision Making Amount and/or Complexity of Data Reviewed External Data Reviewed: labs, radiology and notes. Labs: ordered. Decision-making details documented in ED Course. Radiology: ordered. Decision-making details documented in ED Course.  Risk Prescription drug management. Decision regarding hospitalization. Diagnosis or treatment significantly limited by social determinants of health.       Final diagnoses:  Eloped from emergency department  Vaginal bleeding  Acute cystitis with hematuria  Bacterial vaginosis  Trichomonas vaginalis infection    ED Discharge Orders     None          Eydan Chianese A, PA-C 05/25/24 1730    Lenor Hollering, MD 05/30/24 [phone]

## 2024-05-25 NOTE — ED Triage Notes (Signed)
 Reports vaginal bleeding and abd cramping since this morning. States her last menstrual cycle was 7/15. Wants to make sure it is menstruation, not a miscarriage   Unsure if she could be pregnant.

## 2024-05-25 NOTE — ED Notes (Signed)
 Pt Left from room, RN called to pt and walked toward pt. Pt continued to walk out of ED. Pt's boyfriend stated she had to leave to pick her child up. RN unable to ask pt to wait for EDP to discuss, pt was already out of ED. EDP notified

## 2024-05-26 LAB — GC/CHLAMYDIA PROBE AMP (~~LOC~~) NOT AT ARMC
Chlamydia: NEGATIVE
Comment: NEGATIVE
Comment: NORMAL
Neisseria Gonorrhea: NEGATIVE

## 2024-05-27 DIAGNOSIS — Z419 Encounter for procedure for purposes other than remedying health state, unspecified: Secondary | ICD-10-CM | POA: Diagnosis not present

## 2024-05-27 LAB — URINE CULTURE

## 2024-06-27 DIAGNOSIS — Z419 Encounter for procedure for purposes other than remedying health state, unspecified: Secondary | ICD-10-CM | POA: Diagnosis not present

## 2024-07-21 DIAGNOSIS — Z789 Other specified health status: Secondary | ICD-10-CM | POA: Diagnosis not present

## 2024-07-21 DIAGNOSIS — M79605 Pain in left leg: Secondary | ICD-10-CM | POA: Diagnosis not present

## 2024-08-27 DIAGNOSIS — Z419 Encounter for procedure for purposes other than remedying health state, unspecified: Secondary | ICD-10-CM | POA: Diagnosis not present

## 2024-09-26 DIAGNOSIS — Z419 Encounter for procedure for purposes other than remedying health state, unspecified: Secondary | ICD-10-CM | POA: Diagnosis not present
